# Patient Record
Sex: Male | Born: 1935 | Race: White | Hispanic: No | Marital: Married | Smoking: Former smoker
Health system: Southern US, Community
[De-identification: ages and names within clinical notes are randomized; demographics above are authoritative.]

## PROBLEM LIST (undated history)

## (undated) DIAGNOSIS — I6529 Occlusion and stenosis of unspecified carotid artery: Secondary | ICD-10-CM

## (undated) DIAGNOSIS — I2510 Atherosclerotic heart disease of native coronary artery without angina pectoris: Secondary | ICD-10-CM

## (undated) DIAGNOSIS — I219 Acute myocardial infarction, unspecified: Secondary | ICD-10-CM

## (undated) DIAGNOSIS — M1990 Unspecified osteoarthritis, unspecified site: Secondary | ICD-10-CM

## (undated) DIAGNOSIS — I714 Abdominal aortic aneurysm, without rupture, unspecified: Secondary | ICD-10-CM

## (undated) DIAGNOSIS — E785 Hyperlipidemia, unspecified: Secondary | ICD-10-CM

## (undated) DIAGNOSIS — E119 Type 2 diabetes mellitus without complications: Secondary | ICD-10-CM

## (undated) DIAGNOSIS — I10 Essential (primary) hypertension: Secondary | ICD-10-CM

## (undated) HISTORY — DX: Abdominal aortic aneurysm, without rupture: I71.4

## (undated) HISTORY — DX: Essential (primary) hypertension: I10

## (undated) HISTORY — DX: Occlusion and stenosis of unspecified carotid artery: I65.29

## (undated) HISTORY — DX: Atherosclerotic heart disease of native coronary artery without angina pectoris: I25.10

## (undated) HISTORY — DX: Type 2 diabetes mellitus without complications: E11.9

## (undated) HISTORY — PX: CATARACT EXTRACTION, BILATERAL: SHX1313

## (undated) HISTORY — DX: Abdominal aortic aneurysm, without rupture, unspecified: I71.40

## (undated) HISTORY — DX: Acute myocardial infarction, unspecified: I21.9

## (undated) HISTORY — DX: Hyperlipidemia, unspecified: E78.5

---

## 1993-09-30 DIAGNOSIS — I219 Acute myocardial infarction, unspecified: Secondary | ICD-10-CM

## 1993-09-30 HISTORY — DX: Acute myocardial infarction, unspecified: I21.9

## 2002-04-30 ENCOUNTER — Ambulatory Visit (HOSPITAL_COMMUNITY): Admission: RE | Admit: 2002-04-30 | Discharge: 2002-04-30 | Payer: Self-pay | Admitting: Family Medicine

## 2002-04-30 ENCOUNTER — Encounter: Payer: Self-pay | Admitting: Family Medicine

## 2002-05-04 ENCOUNTER — Ambulatory Visit (HOSPITAL_COMMUNITY): Admission: RE | Admit: 2002-05-04 | Discharge: 2002-05-04 | Payer: Self-pay | Admitting: Family Medicine

## 2002-05-04 ENCOUNTER — Encounter: Payer: Self-pay | Admitting: Family Medicine

## 2002-12-23 ENCOUNTER — Ambulatory Visit (HOSPITAL_COMMUNITY): Admission: RE | Admit: 2002-12-23 | Discharge: 2002-12-23 | Payer: Self-pay | Admitting: General Surgery

## 2003-10-21 ENCOUNTER — Ambulatory Visit (HOSPITAL_COMMUNITY): Admission: RE | Admit: 2003-10-21 | Discharge: 2003-10-21 | Payer: Self-pay | Admitting: Cardiology

## 2004-11-29 ENCOUNTER — Ambulatory Visit: Payer: Self-pay | Admitting: Cardiology

## 2006-02-11 ENCOUNTER — Ambulatory Visit: Payer: Self-pay | Admitting: Cardiology

## 2006-02-27 ENCOUNTER — Ambulatory Visit: Payer: Self-pay | Admitting: Cardiology

## 2006-04-10 ENCOUNTER — Ambulatory Visit: Payer: Self-pay | Admitting: Cardiology

## 2006-11-10 ENCOUNTER — Ambulatory Visit: Payer: Self-pay | Admitting: Vascular Surgery

## 2007-10-08 ENCOUNTER — Encounter: Payer: Self-pay | Admitting: Cardiology

## 2007-11-13 ENCOUNTER — Ambulatory Visit: Payer: Self-pay | Admitting: Vascular Surgery

## 2007-11-20 ENCOUNTER — Ambulatory Visit: Payer: Self-pay | Admitting: Cardiology

## 2007-11-20 ENCOUNTER — Encounter: Payer: Self-pay | Admitting: Physician Assistant

## 2007-12-03 ENCOUNTER — Ambulatory Visit: Payer: Self-pay | Admitting: Cardiology

## 2007-12-25 ENCOUNTER — Ambulatory Visit: Payer: Self-pay | Admitting: Cardiology

## 2008-11-11 ENCOUNTER — Encounter: Payer: Self-pay | Admitting: Cardiology

## 2008-11-11 ENCOUNTER — Ambulatory Visit: Payer: Self-pay | Admitting: Vascular Surgery

## 2009-02-23 ENCOUNTER — Ambulatory Visit: Payer: Self-pay | Admitting: Cardiology

## 2009-02-24 ENCOUNTER — Encounter: Payer: Self-pay | Admitting: Cardiology

## 2009-04-26 ENCOUNTER — Ambulatory Visit: Payer: Self-pay | Admitting: Vascular Surgery

## 2009-04-30 HISTORY — PX: CAROTID ENDARTERECTOMY: SUR193

## 2009-05-25 ENCOUNTER — Inpatient Hospital Stay (HOSPITAL_COMMUNITY): Admission: RE | Admit: 2009-05-25 | Discharge: 2009-05-26 | Payer: Self-pay | Admitting: Vascular Surgery

## 2009-05-25 ENCOUNTER — Encounter: Payer: Self-pay | Admitting: Vascular Surgery

## 2009-05-25 ENCOUNTER — Ambulatory Visit: Payer: Self-pay | Admitting: Vascular Surgery

## 2009-06-09 ENCOUNTER — Encounter: Payer: Self-pay | Admitting: Cardiology

## 2009-06-09 ENCOUNTER — Ambulatory Visit: Payer: Self-pay | Admitting: Vascular Surgery

## 2009-07-03 DIAGNOSIS — R0602 Shortness of breath: Secondary | ICD-10-CM | POA: Insufficient documentation

## 2009-07-03 DIAGNOSIS — R0989 Other specified symptoms and signs involving the circulatory and respiratory systems: Secondary | ICD-10-CM | POA: Insufficient documentation

## 2009-07-03 DIAGNOSIS — I10 Essential (primary) hypertension: Secondary | ICD-10-CM

## 2009-07-03 DIAGNOSIS — I714 Abdominal aortic aneurysm, without rupture, unspecified: Secondary | ICD-10-CM | POA: Insufficient documentation

## 2009-07-03 DIAGNOSIS — E782 Mixed hyperlipidemia: Secondary | ICD-10-CM | POA: Insufficient documentation

## 2009-07-03 DIAGNOSIS — I2510 Atherosclerotic heart disease of native coronary artery without angina pectoris: Secondary | ICD-10-CM | POA: Insufficient documentation

## 2009-10-05 ENCOUNTER — Ambulatory Visit: Payer: Self-pay | Admitting: Cardiology

## 2009-12-22 ENCOUNTER — Encounter: Payer: Self-pay | Admitting: Cardiology

## 2009-12-22 ENCOUNTER — Ambulatory Visit: Payer: Self-pay | Admitting: Vascular Surgery

## 2010-02-16 ENCOUNTER — Encounter: Payer: Self-pay | Admitting: Cardiology

## 2010-05-24 ENCOUNTER — Ambulatory Visit: Payer: Self-pay | Admitting: Vascular Surgery

## 2010-05-25 ENCOUNTER — Encounter: Payer: Self-pay | Admitting: Cardiology

## 2010-06-20 ENCOUNTER — Encounter: Payer: Self-pay | Admitting: Cardiology

## 2010-06-27 ENCOUNTER — Telehealth (INDEPENDENT_AMBULATORY_CARE_PROVIDER_SITE_OTHER): Payer: Self-pay | Admitting: *Deleted

## 2010-07-04 ENCOUNTER — Ambulatory Visit: Payer: Self-pay | Admitting: Cardiology

## 2010-07-04 ENCOUNTER — Encounter (INDEPENDENT_AMBULATORY_CARE_PROVIDER_SITE_OTHER): Payer: Self-pay | Admitting: *Deleted

## 2010-07-06 ENCOUNTER — Ambulatory Visit: Payer: Self-pay | Admitting: Cardiology

## 2010-07-30 ENCOUNTER — Encounter: Payer: Self-pay | Admitting: Cardiology

## 2010-07-31 ENCOUNTER — Encounter: Payer: Self-pay | Admitting: Cardiology

## 2010-10-30 NOTE — Miscellaneous (Signed)
Summary: Orders Update - cbc  Clinical Lists Changes  Orders: Added new Test order of T-CBC No Diff ([id number]) - Signed 

## 2010-10-30 NOTE — Assessment & Plan Note (Signed)
Summary: 6 MO F/U PER NOV REMINDER-JM   Visit Type:  Follow-up Primary Provider:  Neita Carp  CC:  follow-up visit.  History of Present Illness: No chest pain. No dyspnea. s/p CEA no complicatiions. NYHA classI, no orthopnea or PND.No palpitations or syncope.   The patient has known coronary arteries he is 100% occluded proximal right coronary artery.  He also has a history of left carotid endarterectomy in October of 2010 as well as abdominal aortic aneurysm followed by Dr. early.  The patient is otherwise stable from a cardiovascular perspective.  He is concerned however about the cost of his Benicar and is requesting generic Cozaar.  Preventive Screening-Counseling & Management  Alcohol-Tobacco     Smoking Status: quit     Year Quit: 1978  Clinical Review Panels:  CXR CXR results  Heart size normal.  Aorta mildly tortuous.  Lungs   hyperaerated with some scarring at the bases.  No acute process   evident.  There is a probable small calcified granuloma in the left   upper lobe projecting over the second rib peripherally.  Osseous   structures intact.    IMPRESSION:   Chronic changes as above - no active disease. (02/19/2009)  Echocardiogram Echocardiogram Conclusions:         1. The estimated ejection fraction is 55-60%.          2. Global left ventricular wall motion and contractility are within         normal limits.         3. The  basal inferior wall segment is akinetic.         4. There is mild aortic regurgitation.          5. There is mild dilatation of the ascending aorta.         6. There is a trace of mitral regurgitation.         7. The inferior vena cava appears normal. 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  He does not have any significant left coronary  artery disease.   Continued medical therapy is warranted.  Tight control of his  diabetes  including close followup of his hemoglobin A1c would be warranted.   The patient will continue his beta blocker and ACE inhibitor therapy.   Of note, I do not see that he is on a statin drug and followup lipids may be  in order.                                            Charlton Haws, M.D. (10/21/2003)    Current Medications (verified): 1)  Imdur 60 Mg Xr24h-Tab (Isosorbide Mononitrate) .... Take 1 Tablet By Mouth Once A Day 2)  Losartan Potassium 50 Mg Tabs (Losartan Potassium) .... Take 1 Tablet By Mouth Once A Day 3)  Nitroglycerin 0.4 Mg Subl (Nitroglycerin) .... Place One Tablet Under Tongue As Needed For Severe Chest Pain Every 5 Minutes, Not To Exceed 3 in 15 Min Time Frame 4)  Colchicine 0.6 Mg Tabs (Colchicine) .... Take 1 Tablet By Mouth Once A Day 5)  Lovastatin 20 Mg Tabs (Lovastatin) .... Take 1 Tablet By Mouth Once A Day 6)  Glyburide 5 Mg Tabs (Glyburide) .... Take 1 Tablet By Mouth  Two Times A Day 7)  Allopurinol 300 Mg Tabs (Allopurinol) .... Take 1 Tablet By Mouth Once A Day 8)  Hydrochlorothiazide 12.5 Mg Caps (Hydrochlorothiazide) .... Take 1 Tablet By Mouth Once A Day 9)  Aspir-Low 81 Mg Tbec (Aspirin) .... Take 1 Tablet By Mouth Once A Day 10)  Metformin Hcl 500 Mg Tabs (Metformin Hcl) .... Take 1 Tablet By Mouth Daily 11)  Atenolol 50 Mg Tabs (Atenolol) .... Take 1 Tablet By Mouth Three Times A Day  Allergies (verified): No Known Drug Allergies  Comments:  Nurse/Medical Assistant: The patient's medications and allergies were reviewed with the patient and were updated in the Medication and Allergy Lists. Patient brought list to visit.  Past History:  Past Medical History: Last updated: 07/03/2009 ABDOMINAL AORTIC ANEURYSM (ICD-441.4) HYPERTENSION, UNSPECIFIED (ICD-401.9) HYPERLIPIDEMIA-MIXED (ICD-272.4) BRUIT (ICD-785.9) CAD, NATIVE VESSEL (ICD-414.01) DYSPNEA (ICD-786.05) Type 2 diabetes mellitus  Past Surgical History: Last updated:  07/03/2009 Carotid Endarterectomy  Family History: Last updated: 10/05/2009 [place]  Social History: Last updated: 07/03/2009 Retired  Married  Tobacco Use - No.   Risk Factors: Smoking Status: quit (10/05/2009)  Family History: Reviewed history and no changes required. [place]  Social History: Smoking Status:  quit  Review of Systems  The patient denies fatigue, malaise, fever, weight gain/loss, vision loss, decreased hearing, hoarseness, chest pain, palpitations, shortness of breath, prolonged cough, wheezing, sleep apnea, coughing up blood, abdominal pain, blood in stool, nausea, vomiting, diarrhea, heartburn, incontinence, blood in urine, muscle weakness, joint pain, leg swelling, rash, skin lesions, headache, fainting, dizziness, depression, anxiety, enlarged lymph nodes, easy bruising or bleeding, and environmental allergies.    Vital Signs:  Patient profile:   77 year old male Height:      70 inches Weight:      200 pounds BMI:     28.80 Pulse rate:   59 / minute BP sitting:   142 / 70  (left arm) Cuff size:   regular  Vitals Entered By: Carlye Grippe (October 05, 2009 8:24 AM)  Nutrition Counseling: Patient's BMI is greater than 25 and therefore counseled on weight management options. CC: follow-up visit   Physical Exam  General:  Well developed, well nourished, in no acute distress. Head:  normocephalic and atraumatic Eyes:  PERRLA/EOM intact; conjunctiva and lids normal. Nose:  no deformity, discharge, inflammation, or lesions Mouth:  Teeth, gums and palate normal. Oral mucosa normal. Neck:  Neck supple, no JVD. No masses, thyromegaly or abnormal cervical nodes. Lungs:  Clear bilaterally to auscultation and percussion. Heart:  Non-displaced PMI, chest non-tender; regular rate and rhythm, S1, S2 without murmurs, rubs or gallops. Carotid upstroke normal, no bruit. Normal abdominal aortic size, no bruits. Femorals normal pulses, no bruits. Pedals normal pulses. No  edema, no varicosities. Abdomen:  Bowel sounds positive; abdomen soft and non-tender without masses, organomegaly, or hernias noted. No hepatosplenomegaly. Msk:  Back normal, normal gait. Muscle strength and tone normal. Pulses:  pulses normal in all 4 extremities Extremities:  No clubbing or cyanosis. Neurologic:  Alert and oriented x 3. Skin:  Intact without lesions or rashes. Cervical Nodes:  no significant adenopathy Psych:  Normal affect.   Impression & Recommendations:  Problem # 1:  CAD, NATIVE VESSEL (ICD-414.01) the patient reports no chest pain.  Continue aggressive risk factor modification. The following medications were removed from the medication list:    Imdur 60 Mg Xr24h-tab (Isosorbide mononitrate) .Marland Kitchen... Take 1 tablet by mouth once a day His updated medication list for this problem  includes:    Imdur 60 Mg Xr24h-tab (Isosorbide mononitrate) .Marland Kitchen... Take 1 tablet by mouth once a day    Nitroglycerin 0.4 Mg Subl (Nitroglycerin) .Marland Kitchen... Place one tablet under tongue as needed for severe chest pain every 5 minutes, not to exceed 3 in 15 min time frame    Aspir-low 81 Mg Tbec (Aspirin) .Marland Kitchen... Take 1 tablet by mouth once a day    Atenolol 50 Mg Tabs (Atenolol) .Marland Kitchen... Take 1 tablet by mouth three times a day  Problem # 2:  ABDOMINAL AORTIC ANEURYSM (ICD-441.4) fall bivascular surgery.  Problem # 3:  HYPERTENSION, UNSPECIFIED (ICD-401.9) the patient will be changed from Benicar to generic Cozaar at a dose of 50 mg a day. His updated medication list for this problem includes:    Losartan Potassium 50 Mg Tabs (Losartan potassium) .Marland Kitchen... Take 1 tablet by mouth once a day    Hydrochlorothiazide 12.5 Mg Caps (Hydrochlorothiazide) .Marland Kitchen... Take 1 tablet by mouth once a day    Aspir-low 81 Mg Tbec (Aspirin) .Marland Kitchen... Take 1 tablet by mouth once a day    Atenolol 50 Mg Tabs (Atenolol) .Marland Kitchen... Take 1 tablet by mouth three times a day  Problem # 4:  HYPERLIPIDEMIA-MIXED (ICD-272.4) the patient's  current lovastatin and his lipid panel can be monitored by Dr. Neita Carp. His updated medication list for this problem includes:    Lovastatin 20 Mg Tabs (Lovastatin) .Marland Kitchen... Take 1 tablet by mouth once a day  Patient Instructions: 1)  Change Benicar to Losartan 50mg  daily.   2)  Follow up in 6 months.   Prescriptions: LOSARTAN POTASSIUM 50 MG TABS (LOSARTAN POTASSIUM) Take 1 tablet by mouth once a day  #90 x 3   Entered by:   Hoover Brunette, LPN   Authorized by:   Lewayne Bunting, MD, [facility]   Signed by:   Hoover Brunette, LPN on 60/45/4098   Method used:   [facility]        [facility]  [address] [address] [id number]       [address]       [place], [place]  [address]       Ph: [phone] or [phone]       Fax: [phone]   RxID:   [id number]

## 2010-10-30 NOTE — Assessment & Plan Note (Signed)
Summary: 6 MONTH FU-RECV REMINDER VS   Visit Type:  Follow-up Primary Provider:  [place]   History of Present Illness: the patient is a 77 year old male with history of coronary artery disease,AAA at and status post carotid endarterectomy.the patient is followed in [place] by vascular surgery.his aneurysm and carotid artery disease has remained stable.  He reports several weeks ago an episode of prolonged chest pain lasting 3 hours. He also has been complaining of some neck pain and was felt to have arthritis. This particular episode however was in the middle of the night and was radiating to the chest and left shoulder as well as left arm. The patient did not take any nitroglycerin. He does not have any exertional chest pain but now feels that he short of breath on minimal exertion.  The patient did not have a stress test in the last 3 years. His last catheterization was in 2005 when he was found to 100% occluded proximal right coronary artery but no significant disease in the left system.  Preventive Screening-Counseling & Management  Alcohol-Tobacco     Smoking Status: quit     Year Quit: 1978  Current Medications (verified): 1)  Imdur 60 Mg Xr24h-Tab (Isosorbide Mononitrate) .... Take 1 Tablet By Mouth Once A Day 2)  Losartan Potassium 50 Mg Tabs (Losartan Potassium) .... Take 1 Tablet By Mouth Once A Day 3)  Nitroglycerin 0.4 Mg Subl (Nitroglycerin) .... Place One Tablet Under Tongue As Needed For Severe Chest Pain Every 5 Minutes, Not To Exceed 3 in 15 Min Time Frame 4)  Colchicine 0.6 Mg Tabs (Colchicine) .... Take 1 Tablet By Mouth Once A Day 5)  Lovastatin 20 Mg Tabs (Lovastatin) .... Take 1 Tablet By Mouth Once A Day 6)  Glyburide 5 Mg Tabs (Glyburide) .... Take 1 Tablet By Mouth Two Times A Day 7)  Allopurinol 300 Mg Tabs (Allopurinol) .... Take 1 Tablet By Mouth Once A Day 8)  Hydrochlorothiazide 12.5 Mg Caps (Hydrochlorothiazide) .... Take 1 Tablet By Mouth Once A Day 9)   Aspir-Low 81 Mg Tbec (Aspirin) .... Take 1 Tablet By Mouth Once A Day 10)  Metformin Hcl 500 Mg Tabs (Metformin Hcl) .... Take 1 Tablet By Mouth Daily 11)  Atenolol 50 Mg Tabs (Atenolol) .... Take 1 Tablet By Mouth Three Times A Day  Allergies (verified): No Known Drug Allergies  Comments:  Nurse/Medical Assistant: The patient is currently on medications but does not know the name or dosage at this time. Instructed to contact our office with details. Will update medication list at that time.  Past History:  Past Surgical History: Last updated: 07/03/2009 Carotid Endarterectomy  Family History: Last updated: 10/05/2009 [place]  Social History: Last updated: 07/03/2009 Retired  Married  Tobacco Use - No.   Risk Factors: Smoking Status: quit (07/04/2010)  Past Medical History: ABDOMINAL AORTIC ANEURYSM (ICD-441.4) abdominal aortic aneurysm 4.4 x 4.4 cm August 2011 HYPERTENSION, UNSPECIFIED (ICD-401.9) HYPERLIPIDEMIA-MIXED (ICD-272.4) BRUIT (ICD-785.9) CAD, NATIVE VESSEL (ICD-414.01) DYSPNEA (ICD-786.05) Type 2 diabetes mellitus Status post carotid endarterectomy right side Dr. Langston Masker August 2011 no stenosis of the left internal carotid artery and patent right internal carotid artery  Review of Systems       The patient complains of fatigue, chest pain, and shortness of breath.  The patient denies malaise, fever, weight gain/loss, vision loss, decreased hearing, hoarseness, palpitations, prolonged cough, wheezing, sleep apnea, coughing up blood, abdominal pain, blood in stool, nausea, vomiting, diarrhea, heartburn, incontinence, blood in urine, muscle weakness, joint pain,  leg swelling, rash, skin lesions, headache, fainting, dizziness, depression, anxiety, enlarged lymph nodes, easy bruising or bleeding, and environmental allergies.    Vital Signs:  Patient profile:   77 year old male Height:      70 inches Weight:      194 pounds BMI:     27.94 Pulse rate:   57 /  minute BP sitting:   158 / 86  (left arm) Cuff size:   regular  Vitals Entered By: Carlye Grippe (July 04, 2010 10:22 AM)  Nutrition Counseling: Patient's BMI is greater than 25 and therefore counseled on weight management options.  Physical Exam  Additional Exam:  General: Well-developed, well-nourished in no distress,somewhat pale-appearing head: Normocephalic and atraumatic eyes PERRLA/EOMI intact, conjunctiva and lids normal nose: No deformity or lesions mouth normal dentition, normal posterior pharynx neck: Supple, no JVD.  No masses, thyromegaly or abnormal cervical nodes lungs: Normal breath sounds bilaterally without wheezing.  Normal percussion heart: regular rate and rhythm with normal S1 and S2, no S3 or S4.  PMI is normal.  No pathological murmurs abdomen: Normal bowel sounds, abdomen is soft and nontender without masses, organomegaly or hernias noted.  No hepatosplenomegaly musculoskeletal: Back normal, normal gait muscle strength and tone normal pulsus: Pulse is normal in all 4 extremities Extremities: No peripheral pitting edema neurologic: Alert and oriented x 3 skin: Intact without lesions or rashes cervical nodes: No significant adenopathy psychologic: Normal affect    EKG  Procedure date:  07/04/2010  Findings:      sinus bradycardia otherwise normal EKG. Heart rate 55 beats per minute  Impression & Recommendations:  Problem # 1:  DYSPNEA (ICD-786.05) the patient now reports increasing dyspnea. the patient had an episode of chest pain lasting 3 hours. He will be referred for Lexiscan.we'll also make sure that the patient had a CBC done within the last 6 months His updated medication list for this problem includes:    Losartan Potassium 50 Mg Tabs (Losartan potassium) .Marland Kitchen... Take 1 tablet by mouth once a day    Hydrochlorothiazide 12.5 Mg Caps (Hydrochlorothiazide) .Marland Kitchen... Take 1 tablet by mouth once a day    Aspir-low 81 Mg Tbec (Aspirin) .Marland Kitchen... Take 1  tablet by mouth once a day    Atenolol 50 Mg Tabs (Atenolol) .Marland Kitchen... Take 1 tablet by mouth three times a day  Problem # 2:  ABDOMINAL AORTIC ANEURYSM (ICD-441.4) follow bibasilar surgery and stable.  Problem # 3:  CAD, NATIVE VESSEL (ICD-414.01) no acute changes on electrocardiogram. Sinus bradycardia. His updated medication list for this problem includes:    Imdur 60 Mg Xr24h-tab (Isosorbide mononitrate) .Marland Kitchen... Take 1 tablet by mouth once a day    Nitroglycerin 0.4 Mg Subl (Nitroglycerin) .Marland Kitchen... Place one tablet under tongue as needed for severe chest pain every 5 minutes, not to exceed 3 in 15 min time frame    Aspir-low 81 Mg Tbec (Aspirin) .Marland Kitchen... Take 1 tablet by mouth once a day    Atenolol 50 Mg Tabs (Atenolol) .Marland Kitchen... Take 1 tablet by mouth three times a day  Orders: EKG w/ Interpretation (93000) Nuclear Med (Nuc Med)  Problem # 4:  HYPERTENSION, UNSPECIFIED (ICD-401.9) blood pressure elevated in the office today but I was reassured by his wife and his blood pressure is normal at home. His updated medication list for this problem includes:    Losartan Potassium 50 Mg Tabs (Losartan potassium) .Marland Kitchen... Take 1 tablet by mouth once a day    Hydrochlorothiazide 12.5 Mg  Caps (Hydrochlorothiazide) .Marland Kitchen... Take 1 tablet by mouth once a day    Aspir-low 81 Mg Tbec (Aspirin) .Marland Kitchen... Take 1 tablet by mouth once a day    Atenolol 50 Mg Tabs (Atenolol) .Marland Kitchen... Take 1 tablet by mouth three times a day  Patient Instructions: 1)  Your physician has requested that you have an Lexiscan Cardiolite.  For further information please visit [email or web address]  Please follow instruction sheet, as given. If the results of your test are normal or stable, you will receive a letter. If they are abnormal, the nurse will contact you by phone. 2)  Your physician wants you to follow-up in: 6 months. You will receive a reminder letter in the mail one-two months in advance. If you don't receive a letter, please call our office  to schedule the follow-up appointment. 3)  Your physician recommends that you continue on your current medications as directed. Please refer to the Current Medication list given to you today. Prescriptions: NITROGLYCERIN 0.4 MG SUBL (NITROGLYCERIN) place one tablet under tongue as needed for severe chest pain every 5 minutes, not to exceed 3 in 15 min time frame  #25 x 2   Entered by:   Cyril Loosen, RN, BSN   Authorized by:   Lewayne Bunting, MD, [facility]   Signed by:   Cyril Loosen, RN, BSN on 07/04/2010   Method used:   [facility]  [address] [address] [id number]       [address]       [place], [place]  [address]       Ph: [phone] or [phone]       Fax: [phone]   RxID:   [id number] IMDUR 60 MG XR24H-TAB (ISOSORBIDE MONONITRATE) Take 1 tablet by mouth once a day  #90 Tablet x 3   Entered by:   Cyril Loosen, RN, BSN   Authorized by:   Lewayne Bunting, MD, [facility]   Signed by:   Cyril Loosen, RN, BSN on 07/04/2010   Method used:   [facility]        [facility]  [address] [id number]       [address]       [place], [place]  [address]       Ph: [phone] or [phone]       Fax: [phone]   RxID:   [id number]    Appended Document: 6 MONTH FU-RECV REMINDER VS Per Dr. Andee Lineman - could you please check and see if patient had a CBC done within the last 3-6 months. He has dyspnea and appeared pale today. I forgot to order this. can be done at the time of his stress test   Discussed with pt.  States he has labs done at Dr. Dian Situ office every 4 months.  Will send fax to request info from PMD.  If none, will have pt get a [facility].  Patient verbalized understanding.

## 2010-10-30 NOTE — Letter (Signed)
Summary: Lexiscan or Dobutamine Pharmacist, community at [facility]  [address]   [place], [place] [address]   Phone: [phone]  Fax: [phone]      [facility] Cardiovascular Services  Lexiscan or Dobutamine Cardiolite Strss Test    Hubert Sugg  Appointment Date:_  Appointment Time:_  Your doctor has ordered a CARDIOLITE STRESS TEST using a medication to stimulate exercise so that you will not have to walk on the treadmill to determine the condition of your heart during stress. If you take blood pressure medication, ask your doctor if you should take it the day of your test. You should not have anything to eat or drink at least 4 hours before your test is scheduled, and no caffeine, including decaffeinated tea and coffee, chocolate, and soft drinks for 24 hours before your test.  You will need to register at the Outpatient/Main Entrance at the hospital 15 minutes before your appointment time. It is a good idea to bring a copy of your order with you. They will direct you to the Diagnostic Imaging (Radiology) Department.  You will be asked to undress from the waist up and given a hospital gown to wear, so dress comfortably from the waist down for example: Sweat pants, shorts, or skirt Rubber soled lace up shoes (tennis shoes)  Plan on about three hours from registration to release from the hospital  Hold your diabetic medications the morning of your test, if you do not eat breakfast. Hold your HCTZ the morning of your test.

## 2010-10-30 NOTE — Progress Notes (Signed)
Summary: chest pain vs. arthritis  Phone Note Other Incoming   Caller: wife - Alvira Philips  Summary of Call: Had been having pain deep in left shoulder x several weeks (arthritis) .  Saw Dr. Neita Carp yesterday, did x-rays and did confirm alot of arthritis.  Did have episode last p.m. of chest pain in middle of night.  Lasted about an hour, but he did not wake her.  Stated he feels perfectly fine today. States that they think that it may be related to the arthritis though.  Did advise her that if chest pain happened again to go ahead and use the NTG to see if he gets relief.  If not, then call 911 or go to ED for evaluation.  She verbalized understanding.  He does have pending OV scheduled for 10/5.    Initial call taken by: Hoover Brunette, LPN,  June 27, 2010 4:15 PM  Follow-up for Phone Call        OK Follow-up by: Lewayne Bunting, MD, [facility],  July 01, 2010 4:53 PM

## 2010-12-04 ENCOUNTER — Encounter (INDEPENDENT_AMBULATORY_CARE_PROVIDER_SITE_OTHER): Payer: Medicare Other

## 2010-12-04 ENCOUNTER — Other Ambulatory Visit (INDEPENDENT_AMBULATORY_CARE_PROVIDER_SITE_OTHER): Payer: Medicare Other

## 2010-12-04 DIAGNOSIS — I714 Abdominal aortic aneurysm, without rupture, unspecified: Secondary | ICD-10-CM

## 2010-12-04 DIAGNOSIS — I6529 Occlusion and stenosis of unspecified carotid artery: Secondary | ICD-10-CM

## 2010-12-04 DIAGNOSIS — Z48812 Encounter for surgical aftercare following surgery on the circulatory system: Secondary | ICD-10-CM

## 2010-12-12 NOTE — Procedures (Unsigned)
CAROTID DUPLEX EXAM  INDICATION:  Followup carotid artery disease.  HISTORY: Diabetes:  Yes. Cardiac:  MI. Hypertension:  Yes. Smoking:  Previous. Previous Surgery:  Right carotid endarterectomy 05/01/2009. CV History:  The patient is currently asymptomatic. Amaurosis Fugax No, Paresthesias No, Hemiparesis No                                      RIGHT             LEFT Brachial systolic pressure:         128               126 Brachial Doppler waveforms:         WNL               WNL Vertebral direction of flow:        Atypical antegrade                  Atypical antegrade DUPLEX VELOCITIES (cm/sec) CCA peak systolic                   58                70 ECA peak systolic                   69                72 ICA peak systolic                   76                62 ICA end diastolic                   28                18 PLAQUE MORPHOLOGY:                  Heterogeneous     Heterogeneous PLAQUE AMOUNT:                      Mild              Mild PLAQUE LOCATION:                    CCA               Bulb and ICA  IMPRESSION:  Patent right carotid endarterectomy with no hemodynamically significant stenosis.  Left side no hemodynamically significant stenosis.  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  Intimal thickening within the left common carotid artery.  Study stable compared to previous.  ___________________________________________ Larina Earthly, M.D.  OD/MEDQ  D:  12/04/2010  T:  12/04/2010  Job:  [id number]

## 2010-12-12 NOTE — Procedures (Unsigned)
DUPLEX ULTRASOUND OF ABDOMINAL AORTA  INDICATION:  Follow up abdominal aortic aneurysm.  HISTORY: Diabetes:  Yes. Cardiac:  MI. Hypertension:  Yes. Smoking:  Previous. Connective Tissue Disorder: Family History:  No. Previous Surgery:  No.  DUPLEX EXAM:         AP (cm)                   TRANSVERSE (cm) Proximal             1.7 cm                    2.7 cm Mid                  2.5 cm                    2.5 cm Distal               4.7 cm                    4.6 cm Right Iliac          1.5 cm                    1.1 cm Left Iliac           1.5 cm                    1.6 cm  PREVIOUS:  Date:  AP:  4.4  TRANSVERSE:  4.4  IMPRESSION:  Stable appearing abdominal aortic aneurysm within the distal aorta measuring 4.7 x 4.6 cm, a slight increase from previous study.  Fusiform dilatation of the mid aorta with intraluminal thrombus and atherosclerosis visualized within the mid distal aorta.  The left iliac appears dilated and ectatic .  ___________________________________________ Larina Earthly, M.D.  OD/MEDQ  D:  12/04/2010  T:  12/04/2010  Job:  [id number]

## 2010-12-22 ENCOUNTER — Other Ambulatory Visit: Payer: Self-pay | Admitting: Cardiology

## 2010-12-27 NOTE — Telephone Encounter (Signed)
[place] 

## 2011-01-03 ENCOUNTER — Encounter (INDEPENDENT_AMBULATORY_CARE_PROVIDER_SITE_OTHER): Payer: Self-pay | Admitting: Internal Medicine

## 2011-01-05 LAB — PROTIME-INR: INR: 0.9 (ref 0.00–1.49)

## 2011-01-05 LAB — BASIC METABOLIC PANEL
Calcium: 8.6 mg/dL (ref 8.4–10.5)
GFR calc Af Amer: >60 mL/min (ref >60)
GFR calc non Af Amer: 55 mL/min — ABNORMAL LOW (ref >60)
Glucose, Bld: 79 mg/dL (ref 70–99)
Sodium: 136 mEq/L (ref 135–145)

## 2011-01-05 LAB — URINALYSIS, ROUTINE W REFLEX MICROSCOPIC
Nitrite: NEGATIVE
Specific Gravity, Urine: 1.019 (ref 1.005–1.030)
Urobilinogen, UA: 0.2 mg/dL (ref 0.0–1.0)
pH: 5 (ref 5.0–8.0)

## 2011-01-05 LAB — COMPREHENSIVE METABOLIC PANEL
AST: 28 U/L (ref 0–37)
Albumin: 4.1 g/dL (ref 3.5–5.2)
Alkaline Phosphatase: 52 U/L (ref 39–117)
BUN: 37 mg/dL — ABNORMAL HIGH (ref 6–23)
Creatinine, Ser: 1.67 mg/dL — ABNORMAL HIGH (ref 0.4–1.5)
GFR calc Af Amer: 48 mL/min — ABNORMAL LOW (ref >60)
Potassium: 4.7 mEq/L (ref 3.5–5.1)
Total Protein: 7.4 g/dL (ref 6.0–8.3)

## 2011-01-05 LAB — GLUCOSE, CAPILLARY: Glucose-Capillary: 144 mg/dL — ABNORMAL HIGH (ref 70–99)

## 2011-01-05 LAB — TYPE AND SCREEN
ABO/RH(D): O POS
Antibody Screen: NEGATIVE

## 2011-01-05 LAB — CBC
Hemoglobin: 10.5 g/dL — ABNORMAL LOW (ref 13.0–17.0)
Hemoglobin: 12.4 g/dL — ABNORMAL LOW (ref 13.0–17.0)
MCV: 96.7 fL (ref 78.0–100.0)
RBC: 3.16 MIL/uL — ABNORMAL LOW (ref 4.22–5.81)
RBC: 3.72 MIL/uL — ABNORMAL LOW (ref 4.22–5.81)
RDW: 15 % (ref 11.5–15.5)
WBC: 6 10*3/uL (ref 4.0–10.5)
WBC: 6.6 10*3/uL (ref 4.0–10.5)

## 2011-01-05 LAB — APTT: aPTT: 25 seconds (ref 24–37)

## 2011-02-12 NOTE — Assessment & Plan Note (Signed)
[facility]                          [facility] OFFICE NOTE   NAME:Luke [name], Luke [name]                       MRN:          [id number]  DATE:11/20/2007                            DOB:          1929-05-27    PRIMARY CARDIOLOGIST:  Luke Codding, MD,FACC   REASON FOR VISIT:  Annual followup.   HISTORY:  Luke [name] returns to our clinic for continued monitoring of  known coronary artery disease.  He was last seen here in 2007, at which  time he complained of intermittent chest discomfort which was felt to be  atypical.  He had just had a recent adenosine Cardiolite suggestive of  prior inferior infarct with some residual ischemia; EF 51%.   His last cardiac catheterization was in January 2005, showing no change  in a known 100% occluded proximal RCA with left - right distal  collateralization.  There was no significant left system disease and  continued medical therapy was recommended.   Today, Luke [name] states that he has not, in fact, had any chest pain  since he suffered a myocardial infarction in 1995.  He has, however,  developed exertional dyspnea which appears to be progressive, since he  was last seen by [place] here in the clinic.   The patient reports compliance with all of his medications.  He has not  smoked tobacco in 30 some odd years.  He also reports having had a  recent lipid profile, with results reportedly okay.   CURRENT MEDICATIONS:  1. Colchicine.  2. Isosorbide 30 daily.  3. Metformin 500 q.a.m./1000 q.p.m.  4. Actos 15 daily.  5. Lovastatin 20 daily.  6. Atenolol 150 daily.  7. Benicar 40 daily.  8. Glyburide 5 b.i.d.  9. Allopurinol.  10.Hydrochlorothiazide 12.5 daily.  11.Aspirin 81 daily.   PHYSICAL EXAMINATION:  VITAL SIGNS:  Blood pressure 150/80, pulse 62,  regular, weight 205.2, sats 98% on room air.  GENERAL:  A 76 year old male, moderately obese, sitting upright in no  distress.  HEENT:  Normocephalic ,  atraumatic.  NECK:  Palpable bilateral carotid pulses without bruits; no JVD.  LUNGS:  Clear to auscultation all fields.  HEART:  Regular rate and rhythm (S1, S2).  No significant murmurs.  No  rubs.  ABDOMEN:  Protuberant, nontender.  Intact bowel sounds.  No epigastric  pulsatile mass or bruit.  EXTREMITIES:  Palpable bilateral femoral pulses without bruits; palpable  distal pulses without edema.  NEUROLOGICAL:  Without focal deficits.   IMPRESSION:  1. Progressive exertional dyspnea.      a.     Worrisome for coronary artery disease progression.  2. Severe single vessel coronary artery disease.      a.     Known, 100% occluded proximal right coronary artery.      b.     Status post out of hospital inferior myocardial infarction       in 1995; unsuccessful PTCA of 100% occluded RCA Luke [name]).  3. Dyslipidemia.  4. Type 2 diabetes mellitus.  5. Hypertension.  6. Abdominal aortic aneurysm.  a.     3.70 cm by recent ultrasound, followed by Luke [name] in       [place].   PLAN:  1. Schedule low level adenosine stress Cardiolite for risk      stratification.  We will compare this result with his previous      study in 2007 to rule out any significant change in myocardial      perfusion, which might be suggestive of possible coronary artery      disease progression.  We will therefore keep a low threshold for a      repeat cardiac catheterization, and the patient is quite agreeable      with this plan.  2. A 2-D echocardiogram for reassessment of left ventricular function      and rule out of underlying structural abnormalities.  3. Nitroglycerin p.r.n.  4. Request most recent lipid profile from Luke [name] office.  5. Schedule early clinic followup with myself and Luke [name] in 1      month, for review of study results and further recommendations.      Luke Serpe, PA-C  Electronically Signed      Luke Codding, MD,FACC  Electronically  Signed   GS/MedQ  DD: 11/20/2007  DT: 11/21/2007  Job #: [id number]   cc:   Luke [name]

## 2011-02-12 NOTE — Assessment & Plan Note (Signed)
OFFICE VISIT   KYEN, TAITE  DOB:  May 15, 1929                                       06/09/2009  EAVWU#:[id number]   The patient presents today for follow-up of his right carotid  endarterectomy, resection of redundant carotid artery and Dacron patch  angioplasty on May 25, 2009, of his right carotid, he did extremely  well in the hospital and was discharged home on postop day #1, he has  had no complications postoperatively.  His neck incision has healed  quite nicely, he is grossly intact neurologically.  His carotid arteries  are without bruits bilaterally.  I am quite pleased with his early  result as is the patient.  Plan to see him again in 6 months with repeat  carotid duplex and also ultrasound of his known moderate abdominal  aortic aneurysm.   Larina Earthly, M.D.  Electronically Signed   TFE/MEDQ  D:  06/09/2009  T:  06/09/2009  Job:  [id number]   cc:   Learta Codding, MD,FACC  Fara Chute

## 2011-02-12 NOTE — Procedures (Signed)
CAROTID DUPLEX EXAM   INDICATION:  Post-right carotid endarterectomy.   HISTORY:  Diabetes:  Yes  Cardiac:  MI  Hypertension:  Yes  Smoking:  Previous  Previous Surgery:  Right carotid endarterectomy 05/25/2009  CV History:  asymptomatic  Amaurosis Fugax Yes No, Paresthesias Yes No, Hemiparesis Yes No                                       RIGHT             LEFT  Brachial systolic pressure:         150               140  Brachial Doppler waveforms:         Triphasic         Triphasic  Vertebral direction of flow:        Atypical          Antegrade  DUPLEX VELOCITIES (cm/sec)  CCA peak systolic                   69                81  ECA peak systolic                   115               106  ICA peak systolic                   111 (M)           96  ICA end diastolic                   47                32  PLAQUE MORPHOLOGY:                  Calcified         None  PLAQUE AMOUNT:                      Mild              None  PLAQUE LOCATION:                    CCA   IMPRESSION:  1. Patent right ICA with no evidence of restenosis, elevated velocity      most likely due to angle of vessel.  2. No plaque visualized on the left.   ___________________________________________  Larina Earthly, M.D.   CJ/MEDQ  D:  12/22/2009  T:  12/22/2009  Job:  [id number]

## 2011-02-12 NOTE — H&P (Signed)
HISTORY AND PHYSICAL EXAMINATION   April 26, 2009   Re:  Luke, [name]                DOB:  [birth year]   ADMISSION DIAGNOSIS:  Severe asymptomatic right internal carotid artery  stenosis.   HISTORY OF PRESENT ILLNESS:  Luke [name] is a very pleasant 77 year old  gentleman who was found upon physical exam to have a right carotid  bruit.  He subsequently underwent duplex which revealed critical  stenosis in his right internal carotid artery and no significant  stenosis in his left internal carotid artery.  He is well known to me  from the prior followup of an asymptomatic infrarenal abdominal aortic  aneurysm.  He specifically denies any prior amaurosis fugax, transient  ischemic attack or stroke.  He does have a history of a known coronary  artery disease with an occluded proximal right carotid artery.  He  denies any chest pain and does have stable exertional dyspnea.  He does  have type 2 diabetes and dyslipidemia and hypertension.   CURRENT MEDICATIONS:  1. Colchicine 0.6 mg daily.  2. Lovastatin 20 mg daily.  3. Isosorbide 60 mg daily.  4. Metformin 500 mg daily.  5. Atenolol 50 mg t.i.d.  6. Benicar 40 mg daily.  7. Glyburide 5 mg twice daily.  8. Allopurinol 300 mg daily.  9. Hydrochlorothiazide 12.5 mg daily.  10.Aspirin 81 mg daily.   ALLERGIES:  None.   PHYSICAL EXAMINATION:  GENERAL:  Well-developed, well-nourished white  male appearing stated age of 61.  VITAL SIGNS:  His blood pressure is 117/77 right arm, 139/82 left arm,  pulse 62, respiratory rate 18.  NEUROLOGIC:  He is grossly intact neurologically.  NECK:  He does have soft right carotid bruit.  He does not have a left  carotid bruits.  EXTREMITIES:  His radial pulses are 2+ and he has 2+ popliteal and  posterior tibial pulses bilaterally.  ABDOMEN:  Reveals an easily palpable aneurysm which is nontender.  HEART:  His heart is regular rate and rhythm.  CHEST:  Clear bilaterally.   He underwent confirmatory duplex in our office of his right carotid and  this did show severe stenosis in his internal carotid artery and greater  than 80% stenosis.  His internal carotid artery was visualized and was  normal.   I discussed options with Mr. and Mrs. Mata and recommended he undergo  elective endarterectomy for reduction of a stroke risk.  I explained the  1% to 2% risk of stroke with surgery and rare risk of cranial nerve  injury as well.  They understand and wish to proceed.  We have scheduled  this after return from vacation of his wife in August 26.  He  understands symptoms of carotid disease and will notify us immediately  should these occur.   Larina Earthly, M.D.  Electronically Signed   TFE/MEDQ  D:  04/26/2009  T:  04/27/2009  Job:  [id number]   cc:   Learta Codding, MD,FACC  Fara Chute

## 2011-02-12 NOTE — Procedures (Signed)
DUPLEX ULTRASOUND OF ABDOMINAL AORTA   INDICATION:  Followup abdominal aortic aneurysm.   HISTORY:  Diabetes:  Yes.  Cardiac:  MI.  Hypertension:  Yes.  Smoking:  Previous.  Connective Tissue Disorder:  Family History:  No.  Previous Surgery:   DUPLEX EXAM:         AP (cm)                   TRANSVERSE (cm)  Proximal             Not visualized            Not visualized  Mid                  2.3 cm                    2.5 cm  Distal               4.4 cm                    4.4 cm  Right Iliac          1.2 cm                    1.2 cm  Left Iliac           1.3 cm                    1.4 cm   PREVIOUS:  Date:  12/22/2009  AP:  4.24  TRANSVERSE:  4.23   IMPRESSION:  1. Aneurysmal dilatation of the distal abdominal aorta noted with no      significant change in maximum diameter.  2. Significant plaque formation noted within the aneurysmal sac with      no significant increase in Doppler velocities noted.  3. Unable to adequately visualize the proximal aorta due to overlying      bowel gas patterns.  4. The patient was not n.p.o. for this exam.   ___________________________________________  Larina Earthly, M.D.   CH/MEDQ  D:  05/25/2010  T:  05/25/2010  Job:  [id number]

## 2011-02-12 NOTE — Procedures (Signed)
DUPLEX ULTRASOUND OF ABDOMINAL AORTA   INDICATION:  Followup abdominal aortic aneurysm.   HISTORY:  Diabetes:  Yes.  On oral medication.  Cardiac:  MI in September of 1995.  Hypertension:  Yes.  Smoking:  No.  Connective Tissue Disorder:  Family History:  No.  Previous Surgery:  No.   DUPLEX EXAM:         AP (cm)                   TRANSVERSE (cm)  Proximal             1.94 cm                   2.33 cm  Mid                  2.08 cm                   2.29 cm  Distal               3.65 cm                   3.77 cm  Right Iliac          1.20 cm  Left Iliac           1.20 cm   PREVIOUS:  Date: 11/10/2006  AP:  3.88  TRANSVERSE:  3.70   IMPRESSION:  1. Stable measurements of known abdominal aortic aneurysm.  2. Irregular plaque within the aneurysm as noted on previous exams.   ___________________________________________  Larina Earthly, M.D.   DP/MEDQ  D:  11/13/2007  T:  11/15/2007  Job:  [id number]

## 2011-02-12 NOTE — Discharge Summary (Signed)
NAME:  Luke [name], Luke [name]                ACCOUNT NO.:  [id number]   MEDICAL RECORD NO.:  [id number]          PATIENT TYPE:  INP   LOCATION:  3311                         FACILITY:  [facility]   PHYSICIAN:  Larina Earthly, M.D.    DATE OF BIRTH:  09/01/1929   DATE OF ADMISSION:  05/25/2009  DATE OF DISCHARGE:  05/26/2009                               DISCHARGE SUMMARY   FINAL DISCHARGE DIAGNOSES:  1. Right carotid occlusive disease.  2. History of gout.  3. Diabetes.  4. Hypertension.   PROCEDURE PERFORMED:  1. Resection of right common carotid artery with primary closure.  2. Right carotid endarterectomy with 10-French shunt and Dacron patch      angioplasty closure by Dr. Arbie Cookey, May 25, 2009.   COMPLICATIONS:  None.   CONDITION ON DISCHARGE:  Stable, improving.   DISCHARGE MEDICATIONS:  He is instructed to resume all previous  rescheduled medications consisting of,  1. Colchicine 0.6 mg p.o. daily.  2. Lovastatin 20 mg p.o. daily.  3. Isosorbide 60 mg p.o. daily.  4. Metformin 500 mg p.o. daily.  5. Atenolol 50 mg p.o. t.i.d.  6. Benicar 40 mg p.o. daily.  7. Glyburide 5 mg p.o. b.i.d.  8. Allopurinol 200 mg p.o. daily.  9. Hydrochlorothiazide 12.5 mg p.o. daily.  10.Aspirin 81 mg p.o. daily.  11.He has given a prescription for Percocet 5/325 1 p.o. q.4 h. p.r.n.      pain, total #20 were given.   DISPOSITION:  He is discharged home in stable condition with his wounds  healing well.  He has given careful instructions regarding the care of  his wounds and activity level.  He is to see Dr. Arbie Cookey in 2 weeks for  continuing followup.  The office will arrange the visit.   BRIEF IDENTIFYING STATEMENT:  For complete details, please refer the  typed history and physical.  Briefly, this very pleasant 77 year old  gentleman was referred to Dr. Arbie Cookey for right carotid artery occlusive  disease.  Dr. Arbie Cookey evaluated him, found him to be a suitable candidate  for right carotid  endarterectomy for stroke prevention.  He was informed  of the risks and benefits of the procedure and after careful  consideration elected to proceed with surgery.   HOSPITAL COURSE:  Preoperative workup was completed as an outpatient.  He was brought in through same-day surgery and underwent the  aforementioned right carotid endarterectomy.  For complete details,  please refer the typed operative report.  The procedure was without  complications.  He was returned to the Post Anesthesia Care Unit  extubated.  Following stabilization, he was transferred to a bed on a  surgical step-down unit.  He was observed overnight.  The following  morning, he was found to be nonfocal.  He has been stable overnight.  He  was desirous of discharge and was subsequently discharged home in stable  condition.       Wilmon Arms, PA      Larina Earthly, M.D.  Electronically Signed    KEL/MEDQ  D:  05/26/2009  T:  05/27/2009  Job:  [id number]   cc:   Larina Earthly, M.D.

## 2011-02-12 NOTE — Procedures (Signed)
CAROTID DUPLEX EXAM   INDICATION:  Followup right carotid endarterectomy.   HISTORY:  Diabetes:  Yes.  Cardiac:  MI.  Hypertension:  Yes.  Smoking:  Previous.  Previous Surgery:  Right carotid endarterectomy on 05/25/2009.  CV History:  Currently asymptomatic.  Amaurosis Fugax No, Paresthesias No, Hemiparesis No                                       RIGHT             LEFT  Brachial systolic pressure:         130               134  Brachial Doppler waveforms:         Normal            Normal  Vertebral direction of flow:        Not detected      Antegrade  DUPLEX VELOCITIES (cm/sec)  CCA peak systolic                   72                100  ECA peak systolic                   115               94  ICA peak systolic                   74                60  ICA end diastolic                   27                19  PLAQUE MORPHOLOGY:                  Mixed             Mixed  PLAQUE AMOUNT:                      Mild              Mild  PLAQUE LOCATION:                    CCA               ICA / bifurcation   IMPRESSION:  1. Patent right carotid endarterectomy site with no right internal      carotid artery stenosis noted.  2. No hemodynamically significant stenosis of the left internal      carotid artery with mild plaque formation noted, as described      above.  3. No significant change noted when compared to the previous exam on      12/22/2009.   ___________________________________________  Larina Earthly, M.D.   CH/MEDQ  D:  05/25/2010  T:  05/25/2010  Job:  [id number]

## 2011-02-12 NOTE — Op Note (Signed)
NAME:  Luke [name], Luke [name]                ACCOUNT NO.:  [id number]   MEDICAL RECORD NO.:  [id number]          PATIENT TYPE:  INP   LOCATION:  3311                         FACILITY:  [facility]   PHYSICIAN:  Larina Earthly, M.D.    DATE OF BIRTH:  May 23, 1929   DATE OF PROCEDURE:  05/25/2009  DATE OF DISCHARGE:                               OPERATIVE REPORT   PREOPERATIVE DIAGNOSIS:  Severe asymptomatic right internal carotid  stenosis.   POSTOPERATIVE DIAGNOSIS:  Severe asymptomatic right internal carotid  stenosis.   PROCEDURE:  Right carotid endarterectomy, resection of redundant common  carotid artery, and Dacron patch angioplasty.   SURGEON:  Larina Earthly, MD   ASSISTANT:  Wilmon Arms, PA-C   ANESTHESIA:  General endotracheal.   COMPLICATIONS:  None.   DISPOSITION:  To recovery room, stable.   PROCEDURE IN DETAIL:  The patient was taken to the operating room and  placed in the supine position where the area of the right neck was  prepped and draped in the usual sterile fashion.  An incision was made  in the anterior sternocleidomastoid and the sternocleidomastoid  reflected posteriorly and the carotid sheath was opened.  The vagus and  hypoglossal nerves were identified and preserved.  The common carotid  artery was circled with umbilical tape and Rumel tourniquet.  Dissection  was tented onto bifurcation.  Superior thyroid artery was circled to a 2-  0 silk Potts tie.  The external carotid was circled with vessel loop and  the internal carotid was encircled with a umbilical tape and Rumel  tourniquet.  With further mobilization, the internal carotid was  redundant and was concerned regarding kinking.  For this reason, the  external and internal were further mobilized as was the common.  There  was some redundancy in the external carotid, so decision was made to  mobilize this and shorten the common carotid artery.  The superior  thyroid artery was ligated and divided for  mobility and the bifurcation  was mobilized.  There was an ascending pharyngeal branch and this was  divided and was oversewn on the carotid side with a 6-0 Prolene suture  and was clipped distally.  After adequate mobilization, the patient was  given 9000 units of intravenous heparin and after adequate circulation  time, the internal, external, and common carotid arteries were occluded.  The common carotid artery was opened with 11 blade and extended  longitudinally with Potts scissors through the plaque onto the internal  carotid artery.  A 10 shunt was passed up the internal carotid, allowed  to backbleed and then down the common carotid where it was secured with  Rumel tourniquets.  The endarterectomy was begun on the common carotid  artery and the plaque was divided proximally with Potts scissors.  Endarterectomy was carried down to the bifurcation.  The external  carotid was endarterectomized with eversion technique and the internal  carotid was endarterectomized in an open fashion.  After the  endarterectomy and after mobilization, there was redundant common  carotid artery.  The 6-0 Prolene stay sutures were used to  determine the  adequate length of resection and approximately 0.5 cm of common carotid  artery were resected.  Common carotid artery was sewn end-to-end to  itself with a running 6-0 Prolene suture.  Finally, a Dacron Hemashield  Finesse patch was brought onto the field and was sewn as a patch  angioplasty with a running 6-0 Prolene suture.  Prior to completion of  the anastomosis, the shunt was removed and the usual flushing maneuvers  were undertaken.  Anastomosis was completed and flow was restored first  to the external and then the internal carotid artery.  Excellent flow  characteristics were noted with handheld Doppler in the internal and  external carotid arteries.  The patient was given 50 mg of protamine to  reverse the heparin.  There wounds were irrigated  with saline.  Hemostasis was achieved with electrocautery.  Wounds were closed with  several 3-0 Vicryl sutures, reapproximated the sternocleidomastoid over  the carotid sheath.  Next, the platysma was closed with running 3-0  Vicryl suture and finally the skin was closed with a 4-0 subcuticular  Vicryl stitch.  Benzoin and Steri-Strips were applied.  The patient was  extubated in the operating room neurologically intact and transferred to  the recovery room stable condition.      Larina Earthly, M.D.  Electronically Signed     TFE/MEDQ  D:  05/25/2009  T:  05/26/2009  Job:  [id number]

## 2011-02-12 NOTE — Procedures (Signed)
DUPLEX ULTRASOUND OF ABDOMINAL AORTA   INDICATION:  Abdominal aortic aneurysm.   HISTORY:  Diabetes:  No  Cardiac:  MI  Hypertension:  Yes  Smoking:  Previous  Connective Tissue Disorder:  Family History:  No  Previous Surgery:  No   DUPLEX EXAM:         AP (cm)                   TRANSVERSE (cm)  Proximal             2.5 cm                    2.6 cm  Mid                  2.1 cm                    2.3 cm  Distal               4.2 cm                    4.4 cm  Right Iliac          1.3 cm                    1.3 cm  Left Iliac           1.4 cm                    1.5 cm   PREVIOUS:  Date: 11/11/2008  AP:  4.05  TRANSVERSE:  4.17   IMPRESSION:  1. Aneurysm of the distal abdominal aorta with no significant change      in the maximum diameter when compared to the previous examination      on 11/11/2008.  2. Irregular plaque is noted within the aneurysmal sac.   ___________________________________________  Larina Earthly, M.D.   CH/MEDQ  D:  04/26/2009  T:  04/27/2009  Job:  [id number]

## 2011-02-12 NOTE — Assessment & Plan Note (Signed)
[facility]                          [facility]   NAME:Pack, Luke [name]                       MRN:          [id number]  DATE:02/23/2009                            DOB:          [birth year]    REFERRING PHYSICIAN:  Fara Chute   HISTORY OF PRESENT ILLNESS:  The patient is a very pleasant 77 year old  male, who has a routine followup appointment for medication refills.  The patient has known coronary artery disease with 100% occluded  proximal RCA.  He had a Cardiolite study done last year which showed  inferior apical ischemia.  The patient denies any chest pain.  He does  have stabilized chronic exertional dyspnea, but this has not worsened  compared to prior visit.  He has no orthopnea or PND.  No palpitations  or syncope.  He does have an abdominal aortic aneurysm which is followed  closely by Dr.  Arbie Cookey.   MEDICATIONS:  1. Colchicine 0.6 mg p.o. daily.  2. Lovastatin 20 mg p.o. daily.  3. __________ 50 mg 3 tablets p.o. daily.  4. Benicar 40 mg p.o. daily.  5. Glyburide 5 mg p.o. b.i.d.  6. Allopurinol 500 mg p.o. daily.  7. Hydrochlorothiazide 12.5 daily.  8. Aspirin 81 mg p.o. daily.  9. Isosorbide 60 mg p.o. daily.  10.Metformin 500 mg p.o. b.i.d.  11.Nitroglycerin p.r.n. sublingual.   PHYSICAL EXAMINATION:  VITAL SIGNS:  Blood pressure is 136/77, heart  rate is 68, weight is 198 pounds.  NECK:  Normal carotid upstroke bilaterally with loud right carotid  bruit.  LUNGS:  Diminished breath sounds bilaterally.  HEART:  Regular rate and rhythm.  Normal S1 and S2.  No murmurs, rubs,  or gallops.  ABDOMEN:  Soft, nontender.  No rebound or guarding.  Good bowel sounds.  EXTREMITIES:  No cyanosis, clubbing, or edema.  NEURO:  The patient alert and oriented, grossly nonfocal.   PROBLEM LIST:  1. Stable chronic exertional dyspnea.  2. Single-vessel coronary artery disease.      a.     Negative stress Cardiolite study 2009,  ejection fraction 61.      b.     Chronic 100% occluded proximal right coronary artery with       distal cauterization by last cath in 2005.  3. Status post possible inferior myocardial infarction, failed      percutaneous transluminal coronary angioplasty of 100% right      coronary artery occlusion in 1995 [facility].  4. New loud right carotid bruit.  5. Dyslipidemia.  6. Type 2 diabetes mellitus.  7. Hypertension.  8. Abnormal aortic aneurysm, followed by Dr. Arbie Cookey.   PLAN:  1. The patient has a new and very harsh right carotid bruit.  I      suspect he might have significant carotid artery stenosis given his      significant vascular disease.  We will refer the patient for      carotid Dopplers.  If the patient has a high-grade lesion, we will      refer him to Dr. Arbie Cookey.  2. From cardiac  standpoint, I think he is stable.  He does not need      any stress testing.  He has no angina.  3. We refilled the patient's cardiac medications today.  4. The patient will follow up with [place] in 1 year or earlier if he has      an abnormal study.      Learta Codding, MD,FACC  Electronically Signed    GED/MedQ  DD: 02/23/2009  DT: 02/24/2009  Job #: [id number]   cc:   Fara Chute

## 2011-02-12 NOTE — Procedures (Signed)
CAROTID DUPLEX EXAM   INDICATION:  Right carotid disease.   HISTORY:  Diabetes:  Yes.  Cardiac:  MI.  Hypertension:  Yes.  Smoking:  Previous.  Previous Surgery:  No.  CV History:  Currently asymptomatic.  Amaurosis Fugax No, Paresthesias No, Hemiparesis No                                       RIGHT             LEFT  Brachial systolic pressure:  Brachial Doppler waveforms:  Vertebral direction of flow:        Antegrade  DUPLEX VELOCITIES (cm/sec)  CCA peak systolic                   71  ECA peak systolic                   125  ICA peak systolic                   384  ICA end diastolic                   141  PLAQUE MORPHOLOGY:                  Mixed  PLAQUE AMOUNT:                      Moderate/severe  PLAQUE LOCATION:                    ICA/ECA/CCA   IMPRESSION:  Doppler velocities suggest an 80-99% stenosis of the right  proximal internal carotid artery.   ___________________________________________  Larina Earthly, M.D.   CH/MEDQ  D:  04/26/2009  T:  04/26/2009  Job:  [id number]

## 2011-02-12 NOTE — Procedures (Signed)
DUPLEX ULTRASOUND OF ABDOMINAL AORTA   INDICATION:  Follow-up of abdominal aortic aneurysm.   HISTORY:  Diabetes:  Yes  Cardiac:  MI  Hypertension:  Yes  Smoking:  Previous  Connective Tissue Disorder:  Family History:  No  Previous Surgery:  No   DUPLEX EXAM:         AP (cm)                   TRANSVERSE (cm)  Proximal             1.37 cm                   1.75 cm  Mid                  2.07 cm                   2.27 cm  Distal               4.24 cm                   4.27 cm  Right Iliac          1.37 cm                   1.62 cm  Left Iliac           1.52 cm                   2 cm   PREVIOUS:  Date: April 26, 2009  AP:  4.2  TRANSVERSE:  4.4   IMPRESSION:  1. Abdominal aortic aneurysm with largest measurement of 4.24 x 4.27      cm.  2. Abundant plaque is noted within aneurysm sac.   ___________________________________________  Larina Earthly, M.D.   CJ/MEDQ  D:  12/22/2009  T:  12/22/2009  Job:  [id number]

## 2011-02-12 NOTE — Procedures (Signed)
DUPLEX ULTRASOUND OF ABDOMINAL AORTA   INDICATION:  Followup abdominal aortic aneurysm.   HISTORY:  Diabetes:  Yes.  Cardiac:  MI.  Hypertension:  Yes.  Smoking:  Quit 30 years ago.  Connective Tissue Disorder:  Family History:  No.  Previous Surgery:  No.   DUPLEX EXAM:         AP (cm)                   TRANSVERSE (cm)  Proximal             2.13 cm                   2.26 cm  Mid                  2.50 cm                   2.57 cm  Distal               4.05 cm                   4.17 cm  Right Iliac          1.15 cm                   1.15 cm  Left Iliac           1.48 cm                   1.51 cm   PREVIOUS:  Date:  11/13/2007  AP:  3.65  TRANSVERSE:  3.77   IMPRESSION:  1. Abdominal aortic aneurysm shows slight increase from previous study      with largest measurement today of 4.05 cm x 4.17 cm.  2. Left common iliac artery shows focal area of ectasia measuring 1.48      cm x 1.51 cm.  3. Note:  Irregular plaque noted within aneurysm as previously      documented.  4. Dr. Arbie Cookey was informed of the results and the patient was put on 6      month protocol.   ___________________________________________  Larina Earthly, M.D.   AS/MEDQ  D:  11/11/2008  T:  11/11/2008  Job:  [id number]

## 2011-02-12 NOTE — Assessment & Plan Note (Signed)
[facility]                          [facility] NOTE   NAME:SCOTTFarah, Benish                       MRN:          [id number]  DATE:12/25/2007                            DOB:          [birth year]    PRIMARY CARDIOLOGIST:  Learta Codding, MD,FACC.   REASON FOR VISIT:  Scheduled followup.  Please refer to my recent office  note of February 20th, for full details.   I referred Mr. Moree for both an exercise stress Cardiolite, as well as  a 2-D echo, for reassessment of exertional dyspnea.  The patient had not  been seen by [place] here in the clinic since 2007 and had a known, 100%  occlusion of his proximal RCA with distal collateralization.  His last  cardiac catheterization showed no significant changes in his coronary  anatomy, with the noted chronic occlusion of the proximal RCA with left-  right collateralization.  At that time, there was non-obstructive  residual disease of the LAD, diagonal, and circumflex arteries.   His most recent stress test was an adenosine study in May 2007, which  suggested a moderate inferior defect with partial reversibility and some  residual ischemia; calculated EF 51%.   I also ordered a 2-D echo which showed continued preserved LVEF (55% -  60%), with normal contractility and akinesis of the basal inferior wall.  There were no significant valvular abnormalities.   The repeat stress test suggested a medium, completely reversible  apical/inferobasal defect consistent with ischemia.  Left ventricular  function was normal (EF 61%), with normal wall motion.   In comparing this result with his previous stress test in 2007, however,  it was felt that this did not represent any significant change and was  consistent with the known occlusion of his proximal RCA.   Clinically, Mr. Rosemond continues to perform his various activities on his  property, including tending to his dogs and then walking back up the  hill to his  house.  He confirms to me that he does have significant  exertional dyspnea, if he walks too briskly, yet with no associated  chest discomfort.  Walking at a regular pace, however, does not induce  any significant dyspnea.  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   I also requested his most recent lipid profile and this showed excellent  LDL control (67); however, triglycerides (206) and HDL (34) were less  than ideal.  The patient has been on low-dose lovastatin for several  years.   CURRENT MEDICATIONS:  Unchanged from recent office visit.   PHYSICAL EXAMINATION:  VITAL SIGNS:  Blood pressure 154/79, pulse 69,  Weight 207.  GENERAL:  A 77 year old male, sitting upright, no distress.  HEENT:  Normocephalic, atraumatic.  NECK:  Palpable bilateral aortic pulses without bruits; no JVD.  LUNGS:  Clear to auscultation in all fields.  HEART:  Regular rate and rhythm (S1, S2), no significant murmurs.  ABDOMEN:  Protuberant, nontender.  EXTREMITIES:  No significant edema.  NEURO:  No focal deficit pressure.   IMPRESSION:  1. Stable, chronic exertional dyspnea.  2. Known, severe single vessel coronary artery disease.      a.     Recent, stable adenosine stress Cardiolite with previously       documented inferior apical ischemia; EF 61%.      b.     Chronic 100% occluded proximal RCA with distal       collateralization, by last cardiac catheterization in 2005.      c.     Status post OOH inferior myocardial infarction/failed PTCA       of 100% right coronary artery  in 1995 [facility]).  3. Dyslipidemia.  4. Mixed dyslipidemia.      a.     Elevated triglycerides/low HDL.  5. Type 2 diabetes mellitus.  6. Hypertension.  7. Abdominal artic aneurysm, followed by Dr. Gretta Began in [place].   PLAN:  1. Continue medical therapy, given the patient is not reporting any      development of exertional angina pectoris.  Moreover, his most      recent  stress test appears unchanged from the previous study in      2007.  Left ventricular function also remains preserved, as      indicated both by his recent perfusion imaging study as well as 2-D      echocardiography.  We will, however, continue close monitoring and      keep a low threshold for repeat cardiac catheterization, in the      event that he develops any exertional angina pectoris.  2. Adjust medical regimen with up-titration of Imdur to 240 mg daily.      This is not so much for treatment of any chest pain, which he is      not currently reporting, but so as to try to increase myocardial      perfusion, given his known underlying disease.  3. Start fish oil 1 gram b.i.d. for treatment of mixed dyslipidemia,      with particular emphasis on his elevated triglycerides and low HDL      level.  He will need a follow-up FLP/liver profile in 12 weeks.  4. Schedule return clinic followup with myself and Dr. Andee Lineman in 3      months for continued close monitoring and further recommendations.      Gene Serpe, PA-C  Electronically Signed      Learta Codding, MD,FACC  Electronically Signed   GS/MedQ  DD: 12/25/2007  DT: 12/26/2007  Job #: [id number]   cc:   Fara Chute

## 2011-02-15 NOTE — Assessment & Plan Note (Signed)
[facility]                            [facility]   NAME:Luke [name], Luke [name]                       MRN:          [id number]  DATE:04/10/2006                            DOB:          [birth year]    PRIMARY CARE:  Dr. Fara Chute.   Luke [name] is a very pleasant 77 year old Caucasian gentleman with a history  of coronary artery disease with an occluded right coronary artery with left  and right collaterals.  The patient states he has had recurrent chest pain  which is left-sided with radiation to the left neck and left arm, not really  associated with any activity.  He has had several episodes over the last few  months.  He saw Dr. Andee Lineman back in May for above complaint, was referred for  a stress Myoview.  The patient underwent Myoview stress on Feb 27, 2006.  Perfusion data demonstrated a moderate-sized inferior defect with partial  reversibility suggestive of prior infarct or scarring and some residual  ischemia.  The gated left ventricular ejection fraction was 64%.  Overall  this represented a low-risk myocardial perfusion study.  The patient returns  today to discuss results of exam.  He is still complaining of the  intermittent chest discomfort symptoms as stated above.  The patient  describes it as more of a sore sensation.  He does however continue to walk  two miles a day without any dyspnea on exertion or any chest pain during his  walk.  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  When asked  about his truck, he states there is a lot of jarring on the road.  He also  is using his arms to pull up and get into the cab.  He is concerned maybe  his discomfort is more musculoskeletal than cardiac and actually mentioned  to Dr. Neita Carp his chest discomfort.  It was brought up at one point the  possibility of bursitis and possibly receiving an injection of cortisone to  see if there was any relief.   PAST MEDICAL HISTORY:  1.  Coronary artery disease.  Cardiac history dates back to 1995 when he      suffered an inferior wall myocardial infarction.  Cardiac      catheterization showed right coronary artery completely occluded and      PTCA was attempted but unsuccessful.  The patient had no evidence of      significant other coronary artery disease.  His left ventricular      function has been within normal limits.  2.  GERD.  3.  Prior history of tobacco use.  4.  Type-2 diabetes.  5.  Hyperlipidemia.  6.  Hypertension.  7.  History of gout.   CURRENT MEDICATIONS:  1.  Allopurinol 300 mg daily.  2.  Colchicine 0.6 mg daily.  3.  Aspirin 81 mg daily.  4.  Glyburide/metformin 5/500 b.i.d.  5.  Atenolol 50 mg t.i.d.  6.  Benicar 40 mg daily.  7.  Lovastatin 20 mg daily.  8.  Actos 15 mg  daily.   ALLERGIES:  NO KNOWN DRUG ALLERGIES.   REVIEW OF SYSTEMS:  As stated above in history of illness, otherwise  negative.   PHYSICAL EXAMINATION:  Weight 201 pounds, blood pressure 112/60, heart rate  64.  The patient is a 77 year old Caucasian gentleman in no acute distress,  very pleasant and cooperative gentleman.  Normocephalic, atraumatic.  Pupils  equal, round and reactive to light.  Sclerae clear.  NECK:  Supple without lymphadenopathy, negative bruit, negative JVD.  CHEST:  Clear to auscultation bilaterally.  CARDIAC:  S1 and S2, regular rate and rhythm.  ABDOMEN:  Soft, nontender.  LOWER EXTREMITIES:  Without clubbing, cyanosis or edema.  The patient had 2+  DPs bilateral.   IMPRESSION:  Chest pain, somewhat atypical features.  Recently started on  Imdur 30 mg without change in chest discomfort.  Status post recent nuclear  Myoview showing low-risk probability.  The plan at this time is to continue  current medications.  Will have patient follow up with Dr. Neita Carp for  cortisone injection.  If no relief in chest discomfort, the patient is to  call our office for a followup appointment.  At that  time, we will either:  1) increase his Imdur, or 2) proceed with cardiac catheterization if patient  wishes.  My impression is the patient would proceed with cardiac  catheterization as he is anxious to find out what is causing his chest  discomfort.  I have also told him if it becomes intense or the patient  becomes more symptomatic, he is to call 9-1-1 or seek immediate medical  assistance.  Otherwise, he will continue with current medical regimen and  follow as needed.                                   Dorian Pod, ACNP                                Learta Codding, MD, [facility]   MB/MedQ  DD:  04/10/2006  DT:  04/10/2006  Job #:  [id number]   cc:   Fara Chute

## 2011-02-15 NOTE — H&P (Signed)
NAME:  Luke [name], Luke [name] NO.:  [id number]   MEDICAL RECORD NO.:  [id number]                  PATIENT TYPE:   LOCATION:                                       FACILITY:   PHYSICIAN:  Dalia Heading, M.D.               DATE OF BIRTH:  May 08, 1929   DATE OF ADMISSION:  DATE OF DISCHARGE:                                HISTORY & PHYSICAL   CHIEF COMPLAINT:  Lower abdominal pain of unknown etiology.   HISTORY OF PRESENT ILLNESS:  The patient is a 77 year old white male who is  referred for evaluation and treatment of lower abdominal pain.  He states  that he occasionally has lower abdominal pain which resolves on its own.  He  has a known history of a small aortic aneurysm which has not changed in size  with time as per a recent CT scan.  He denies any lightheadedness, weight  loss, fever, constipation, diarrhea, hematochezia, or melena.  He denies  hemorrhoidal problems.  There is no family history of colon carcinoma.  He  has never had a colonoscopy.   PAST MEDICAL HISTORY:  1. Gout.  2. Non-insulin dependent diabetes mellitus.  3. Hypertension.   PAST SURGICAL HISTORY:  Unremarkable.   CURRENT MEDICATIONS:  1. Allopurinol 300 mg p.o. daily.  2. Glucovance 2.5/500 mg p.o. b.i.d.  3. Atenolol 50 mg p.o. t.i.d.  4. Colchicine 0.6 mg p.o. daily.  5. Baby aspirin daily.   ALLERGIES:  No known drug allergies.   REVIEW OF SYSTEMS:  The patient had an MI in 1995.  He has had no further  episodes of angina, chest pain, shortness of breath, leg swelling, CVA, or  bleeding disorders.   PHYSICAL EXAMINATION:  GENERAL:  The patient is a well-developed, well-  nourished white male in no acute distress.  VITAL SIGNS:  He is afebrile and vital signs are stable.  LUNGS:  Clear to auscultation with equal breath sounds bilaterally.  HEART:  Regular rate and rhythm without S3, S4, or murmurs.  ABDOMEN:  Soft, nontender, nondistended.  No pulsatile masses  noted.  Diastasis recti is noted.  No hernias are noted.  RECTAL:  Examination was deferred to the procedure.   IMPRESSION:  Abdominal pain of unknown etiology.   PLAN:  The patient is scheduled for a colonoscopy on December 23, 2002.  The  risks and benefits of the procedure including bleeding and perforation were  fully explained to the patient, gave informed consent.                                               Dalia Heading, M.D.    MAJ/MEDQ  D:  12/09/2002  T:  12/09/2002  Job:  [id number]   cc:  Fara Chute  [address] [place]  [place] [address]  Fax: [phone]

## 2011-02-15 NOTE — Cardiovascular Report (Signed)
NAME:  Luke [name], Luke [name]                          ACCOUNT NO.:  [id number]   MEDICAL RECORD NO.:  [id number]                   PATIENT TYPE:  OIB   LOCATION:  2899                                 FACILITY:  [facility]   PHYSICIAN:  Charlton Haws, M.D.                  DATE OF BIRTH:  [birth year]   DATE OF PROCEDURE:  10/21/2003  DATE OF DISCHARGE:                              CARDIAC CATHETERIZATION   INDICATION:  A 77 year old patient with history of occluded right coronary  artery, increasing chest pain and dyspnea.   PROCEDURE:  Standard catheterization was done from the right femoral artery  with 4 French catheters.  With 4 French catheters, we were unable to cross  the aortic valve even with a stiff J-wire.  I do not think there is any  significant aortic stenosis, but the catheters were only 4 [place].   Native left main coronary artery was normal.   Left anterior descending artery was somewhat calcified proximally.  There  were 20-30% multiple discrete lesions in the mid portion.   First diagonal branch had 20% multiple discrete lesion.   Second diagonal branch had 30% ostial lesion.   The circumflex coronary artery had 30-40% tubular disease in the mid vessel.   There were left-to-right collaterals to the distal PLA and PDA.   The right coronary artery was 100% occluded proximally.  This is an old  lesion and did not look any different from his previous cath.   IMPRESSION:  The patient has an old total occlusion of  the right coronary  artery with collaterals.  He does not have any significant left coronary  artery disease.   Continued medical therapy is warranted.  Tight control of his diabetes  including close followup of his hemoglobin A1c would be warranted.   The patient will continue his beta blocker and ACE inhibitor therapy.   Of note, I do not see that he is on a statin drug and followup lipids may be  in order.                                               Charlton Haws, M.D.    PN/MEDQ  D:  10/21/2003  T:  10/21/2003  Job:  [id number]   cc:   Fara Chute  [address] [place]  [place] [address]  Fax: [phone]   Jonelle Sidle, M.D. [facility]   [facility] in [place]

## 2011-05-28 ENCOUNTER — Encounter: Payer: Self-pay | Admitting: *Deleted

## 2011-05-28 ENCOUNTER — Ambulatory Visit (INDEPENDENT_AMBULATORY_CARE_PROVIDER_SITE_OTHER): Payer: Medicare Other | Admitting: Cardiology

## 2011-05-28 VITALS — BP 138/84 | HR 65 | Ht 70.0 in | Wt 189.0 lb

## 2011-05-28 DIAGNOSIS — E785 Hyperlipidemia, unspecified: Secondary | ICD-10-CM

## 2011-05-28 DIAGNOSIS — I714 Abdominal aortic aneurysm, without rupture: Secondary | ICD-10-CM

## 2011-05-28 DIAGNOSIS — R0602 Shortness of breath: Secondary | ICD-10-CM

## 2011-05-28 DIAGNOSIS — I2510 Atherosclerotic heart disease of native coronary artery without angina pectoris: Secondary | ICD-10-CM

## 2011-05-28 NOTE — Patient Instructions (Signed)
Continue all current medications. Your physician wants you to follow up in: 6 months.  You will receive a reminder letter in the mail one-two months in advance.  If you don't receive a letter, please call our office to schedule the follow up appointment   

## 2011-05-28 NOTE — Assessment & Plan Note (Signed)
Followed by vascular surgery. 

## 2011-05-28 NOTE — Assessment & Plan Note (Signed)
Coronary artery disease continue risk factor modification: Normal LV size and contraction with no wall motion abnormalities

## 2011-05-28 NOTE — Progress Notes (Signed)
HPI The patient is a 77 year old male with history of coronary arteries, 100% occluded proximal right coronary artery but no significant disease in the left system., AAA is status post carotid endarterectomy.  The patient reports chronic fatigue lack of energy, feeling easily short of breath. However over the last several years he has curtailed his exercise and doesn't really walk anymore. He still does work in the yard. He denies however any chest tightness orthopnea or PND. He apparently had blood work done recently by his primary care physician everything was within normal limits. Although he initially had lost some weight and his weight is now back up again and feels a little bit improvement in his stamina. The patient appears pale and had some concerns about anemia last time reportedly he was reassured by his primary care physician several weeks ago but his blood counts were normal. The patient is closely followed by vascular surgery for his AAA and carotid disease. He recently saw him in consultation. Of note is that the patient's stress test done several months ago which showed a defect in the inferior wall this is what is occluded RCA. We decided to treat him medically. A bedside echocardiogram today was performed and there were no wall motion abnormalities and his ejection fraction was 65%. There was some mild aortic valve calcification but otherwise a normal mitral valve. Right heart structures also appear to be within normal limits.  No Known Allergies  Current Outpatient Prescriptions on File Prior to Visit  Medication Sig Dispense Refill  . losartan (COZAAR) 50 MG tablet TAKE 1 TABLET BY MOUTH ONCE A DAY  90 tablet  3    Past Medical History  Diagnosis Date  . Abdominal aortic aneurysm     4.4 x 4.4 cm August 2011  . Hypertension   . Hyperlipidemia   . Bruit   . CAD (coronary artery disease)   . Dyspnea   . Type 2 diabetes mellitus     Past Surgical History  Procedure Date  .  Carotid endarterectomy 04/2010    Dr. Langston Masker no stenosis of the left internal carotid artery and patent right internal carotid artery    No family history on file.  History   Social History  . Marital Status: Married    Spouse Name: N/A    Number of Children: N/A  . Years of Education: N/A   Occupational History  . Not on file.   Social History Main Topics  . Smoking status: Former Smoker -- 2.0 packs/day for 30 years    Types: Cigarettes    Quit date: 10/27/1976  . Smokeless tobacco: Never Used  . Alcohol Use: Not on file  . Drug Use: Not on file  . Sexually Active: Not on file   Other Topics Concern  . Not on file   Social History Narrative  . No narrative on file   RUE:[id number] positives as outlined above. The remainder of the 18  point review of systems is negative  PHYSICAL EXAM BP 138/84  Pulse 65  Ht 5\' 10"  (1.778 m)  Wt 189 lb (85.73 kg)  BMI 27.12 kg/m2  General: Well-developed, well-nourished in no distress Head: Normocephalic and atraumatic Eyes:PERRLA/EOMI intact, conjunctiva and lids normal Ears: No deformity or lesions Mouth:normal dentition, normal posterior pharynx Neck: Supple, no JVD.  No masses, thyromegaly or abnormal cervical nodes Lungs: Normal breath sounds bilaterally without wheezing.  Normal percussion Cardiac: regular rate and rhythm with normal S1 and S2, no S3 or S4.  PMI is normal.  No pathological murmurs Abdomen: Normal bowel sounds, abdomen is soft and nontender without masses, organomegaly or hernias noted.  No hepatosplenomegaly MSK: Back normal, normal gait muscle strength and tone normal Vascular: Pulse is normal in all 4 extremities Extremities: No peripheral pitting edema Neurologic: Alert and oriented x 3 Skin: Intact without lesions or rashes Lymphatics: No significant adenopathy Psychologic: Normal affect  ECG: Not available  ASSESSMENT AND PLAN

## 2011-05-28 NOTE — Assessment & Plan Note (Signed)
Continue risk factor modification 

## 2011-05-28 NOTE — Assessment & Plan Note (Signed)
Chronic dyspnea likely secondary to deconditioning: Encouraged patient to start walking again one or 2 miles every other day.

## 2011-05-31 DIAGNOSIS — R079 Chest pain, unspecified: Secondary | ICD-10-CM

## 2011-06-05 ENCOUNTER — Encounter: Payer: Self-pay | Admitting: Vascular Surgery

## 2011-06-11 ENCOUNTER — Ambulatory Visit: Payer: Medicare Other | Admitting: Vascular Surgery

## 2011-06-11 ENCOUNTER — Other Ambulatory Visit: Payer: Medicare Other

## 2011-06-25 ENCOUNTER — Ambulatory Visit (INDEPENDENT_AMBULATORY_CARE_PROVIDER_SITE_OTHER): Payer: Medicare Other | Admitting: Cardiology

## 2011-06-25 ENCOUNTER — Encounter: Payer: Self-pay | Admitting: Cardiology

## 2011-06-25 VITALS — BP 146/77 | HR 60 | Ht 70.0 in | Wt 189.0 lb

## 2011-06-25 DIAGNOSIS — E785 Hyperlipidemia, unspecified: Secondary | ICD-10-CM

## 2011-06-25 DIAGNOSIS — I2510 Atherosclerotic heart disease of native coronary artery without angina pectoris: Secondary | ICD-10-CM

## 2011-06-25 DIAGNOSIS — I10 Essential (primary) hypertension: Secondary | ICD-10-CM

## 2011-06-25 DIAGNOSIS — N189 Chronic kidney disease, unspecified: Secondary | ICD-10-CM | POA: Insufficient documentation

## 2011-06-25 DIAGNOSIS — I779 Disorder of arteries and arterioles, unspecified: Secondary | ICD-10-CM | POA: Insufficient documentation

## 2011-06-25 DIAGNOSIS — I714 Abdominal aortic aneurysm, without rupture: Secondary | ICD-10-CM

## 2011-06-25 NOTE — Assessment & Plan Note (Signed)
Patient has followup appointment scheduled both for his aneurysm and carotid artery disease with vascular surgery.

## 2011-06-25 NOTE — Assessment & Plan Note (Signed)
Blood pressure borderline. I told the patient to hold off on Celebrex and use Tylenol when necessary

## 2011-06-25 NOTE — Assessment & Plan Note (Signed)
Recent hospitalization with atypical chest pain. Ruled out for myocardial infarction. Continue medical therapy

## 2011-06-25 NOTE — Progress Notes (Signed)
HPI The patient was recently hospitalized with atypical chest pain. This is felt to be noncardiac. He ruled out for myocardial infarction. The patient has known coronary artery disease with occluded right coronary artery with left to right collaterals. Last catheterization was in 2005. The patient reports of some chest congestion but no shortness of breath orthopnea PND. He has a postnasal drip. He reports a cough at night. From a cardiac standpoint has remained stable.  No Known Allergies  Current Outpatient Prescriptions on File Prior to Visit  Medication Sig Dispense Refill  . allopurinol (ZYLOPRIM) 300 MG tablet Take 1 tablet by mouth Daily.      Marland Kitchen aspirin 81 MG chewable tablet Chew 81 mg by mouth daily.        Marland Kitchen atenolol (TENORMIN) 50 MG tablet Take 3 tablets by mouth Daily.      . CELEBREX 200 MG capsule Take 1 capsule by mouth Daily.      Marland Kitchen COLCRYS 0.6 MG tablet Take 1 tablet by mouth Every 1 hour. Until gout relieves      . fish oil-omega-3 fatty acids 1000 MG capsule Take 1 g by mouth daily.        Marland Kitchen glyBURIDE (DIABETA) 5 MG tablet Take 1 tablet by mouth Twice daily.      . hydrochlorothiazide (,MICROZIDE/HYDRODIURIL,) 12.5 MG capsule Take 1 capsule by mouth Daily.      . isosorbide mononitrate (IMDUR) 60 MG 24 hr tablet Take 1 tablet by mouth Daily.      Marland Kitchen losartan (COZAAR) 50 MG tablet TAKE 1 TABLET BY MOUTH ONCE A DAY  90 tablet  3  . lovastatin (MEVACOR) 20 MG tablet Take 1 tablet by mouth Daily.      . metFORMIN (GLUCOPHAGE) 1000 MG tablet Take 1 tablet by mouth Twice daily.      Marland Kitchen olmesartan (BENICAR) 40 MG tablet Take 40 mg by mouth daily.        . pioglitazone (ACTOS) 15 MG tablet Take 15 mg by mouth daily.          Past Medical History  Diagnosis Date  . Abdominal aortic aneurysm     4.4 x 4.4 cm August 2011  . Hypertension   . Hyperlipidemia   . Bruit   . CAD (coronary artery disease)   . Dyspnea   . Type 2 diabetes mellitus   . Diabetes mellitus Age 77  .  Myocardial infarction 1995  . Gout   . Carotid artery occlusion     Past Surgical History  Procedure Date  . Carotid endarterectomy 04/2009    Dr. Langston Masker no stenosis of the left internal carotid artery and patent right internal carotid artery    Family History  Problem Relation Age of Onset  . Heart disease Mother     History   Social History  . Marital Status: Married    Spouse Name: N/A    Number of Children: N/A  . Years of Education: N/A   Occupational History  . Not on file.   Social History Main Topics  . Smoking status: Former Smoker -- 2.0 packs/day for 30 years    Types: Cigarettes    Quit date: 10/27/1976  . Smokeless tobacco: Never Used  . Alcohol Use: No  . Drug Use: No  . Sexually Active: Not on file   Other Topics Concern  . Not on file   Social History Narrative  . No narrative on file   review of systems:Pertinent positives as  outlined above. The remainder of the 18  point review of systems is negative  PHYSICAL EXAM BP 146/77  Pulse 60  Ht 5\' 10"  (1.778 m)  Wt 189 lb (85.73 kg)  BMI 27.12 kg/m2  General: Well-developed, well-nourished in no distress Head: Normocephalic and atraumatic Eyes:PERRLA/EOMI intact, conjunctiva and lids normal Ears: No deformity or lesions Mouth:normal dentition, normal posterior pharynx Neck: Supple, no JVD.  No masses, thyromegaly or abnormal cervical nodes Lungs: Normal breath sounds bilaterally without wheezing.  Normal percussion Cardiac: regular rate and rhythm with normal S1 and S2, no S3 or S4.  PMI is normal.  No pathological murmurs Abdomen: Normal bowel sounds, abdomen is soft and nontender without masses, organomegaly or hernias noted.  No hepatosplenomegaly MSK: Back normal, normal gait muscle strength and tone normal Vascular: Pulse is normal in all 4 extremities Extremities: No peripheral pitting edema Neurologic: Alert and oriented x 3 Skin: Intact without lesions or rashes Lymphatics: No  significant adenopathy Psychologic: Normal affect  ECG: Not available  ASSESSMENT AND PLAN

## 2011-06-25 NOTE — Patient Instructions (Signed)
Continue all current medications. Your physician wants you to follow up in: 6 months.  You will receive a reminder letter in the mail one-two months in advance.  If you don't receive a letter, please call our office to schedule the follow up appointment   

## 2011-06-25 NOTE — Assessment & Plan Note (Signed)
Followed by his primary care physician. 

## 2011-07-03 ENCOUNTER — Other Ambulatory Visit: Payer: Self-pay | Admitting: *Deleted

## 2011-07-03 MED ORDER — ISOSORBIDE MONONITRATE ER 60 MG PO TB24: 60.0000 mg | ORAL_TABLET | Freq: Every day | ORAL | Status: DC

## 2011-07-16 ENCOUNTER — Encounter (INDEPENDENT_AMBULATORY_CARE_PROVIDER_SITE_OTHER): Payer: Medicare Other | Admitting: Vascular Surgery

## 2011-07-16 ENCOUNTER — Other Ambulatory Visit (INDEPENDENT_AMBULATORY_CARE_PROVIDER_SITE_OTHER): Payer: Medicare Other | Admitting: Vascular Surgery

## 2011-07-16 ENCOUNTER — Ambulatory Visit: Payer: Medicare Other | Admitting: Vascular Surgery

## 2011-07-16 DIAGNOSIS — Z48812 Encounter for surgical aftercare following surgery on the circulatory system: Secondary | ICD-10-CM

## 2011-07-16 DIAGNOSIS — I714 Abdominal aortic aneurysm, without rupture: Secondary | ICD-10-CM

## 2011-07-16 DIAGNOSIS — I6529 Occlusion and stenosis of unspecified carotid artery: Secondary | ICD-10-CM

## 2011-07-16 NOTE — Progress Notes (Unsigned)
AAA U/S performed @[facility]  07/16/2011

## 2011-07-16 NOTE — Progress Notes (Unsigned)
Carotid done 07/16/2011

## 2011-07-23 NOTE — Procedures (Unsigned)
CAROTID DUPLEX EXAM  INDICATION:  Followup carotid stenosis.  HISTORY: Diabetes:  Yes. Cardiac:  MI. Hypertension:  Yes. Smoking:  Previous. Previous Surgery:  Right carotid endarterectomy on 05/01/2009. CV History:  Asymptomatic. Amaurosis Fugax No, Paresthesias No, Hemiparesis No                                      RIGHT             LEFT Brachial systolic pressure:         132               128 Brachial Doppler waveforms:         WNL               WNL Vertebral direction of flow:        Antegrade         Antegrade DUPLEX VELOCITIES (cm/sec) CCA peak systolic                   50                95 ECA peak systolic                   71                79 ICA peak systolic                   57                67 ICA end diastolic                   21                25 PLAQUE MORPHOLOGY:                  N/V               Heterogeneous PLAQUE AMOUNT:                      N/V               Mild PLAQUE LOCATION:                    N/V               CCA/ICA  IMPRESSION: 1. Right internal carotid artery is patent with history of     endarterectomy, no restenosis or hyperplasia is identified. 2. Left internal carotid artery stenosis in the 1%-39% range. 3. Patent and antegrade bilateral vertebral arteries. 4. Unchanged since previous study on 12/04/2010.  ___________________________________________ Larina Earthly, M.D.  SH/MEDQ  D:  07/16/2011  T:  07/16/2011  Job:  [id number]

## 2011-07-23 NOTE — Procedures (Unsigned)
DUPLEX ULTRASOUND OF ABDOMINAL AORTA  INDICATION:  Followup abdominal aortic aneurysm.  HISTORY: Diabetes:  Yes. Cardiac:  MI. Hypertension:  Yes. Smoking:  Previous. Connective Tissue Disorder: Family History:  No. Previous Surgery:  No aortic intervention.  DUPLEX EXAM:         AP (cm)                   TRANSVERSE (cm) Proximal             2.37 cm                   2.57 cm Mid                  2.49 cm                   2.65 cm Distal               5.25 cm                   5.23 cm Right Iliac          1.03-P/2.14-D             1.03-P/2.22-D Left Iliac           1.08 cm                   1.22 cm  PREVIOUS:  Date:  12/04/2010  AP:  4.7  TRANSVERSE:  4.6  IMPRESSION: 1. Aneurysmal dilatation of the distal aorta measuring 5.25 cm x 5.23     cm with intramural thrombus and heterogeneous plaque present. 2. Right distal common iliac artery aneurysm measuring 2.14 cm x 2.22     cm in diameter with intramural thrombus and heterogeneous plaque     present. 3. This is an increase in diameter since previous study on 12/04/2010     with new finding of iliac artery aneurysm.  ___________________________________________ Larina Earthly, M.D.  SH/MEDQ  D:  07/16/2011  T:  07/16/2011  Job:  [id number]

## 2011-07-26 ENCOUNTER — Encounter: Payer: Self-pay | Admitting: Vascular Surgery

## 2011-07-29 ENCOUNTER — Encounter: Payer: Self-pay | Admitting: Vascular Surgery

## 2011-07-29 ENCOUNTER — Other Ambulatory Visit: Payer: Self-pay | Admitting: Vascular Surgery

## 2011-07-29 DIAGNOSIS — I714 Abdominal aortic aneurysm, without rupture: Secondary | ICD-10-CM

## 2011-07-29 DIAGNOSIS — I6529 Occlusion and stenosis of unspecified carotid artery: Secondary | ICD-10-CM

## 2011-07-29 DIAGNOSIS — Z48812 Encounter for surgical aftercare following surgery on the circulatory system: Secondary | ICD-10-CM

## 2011-07-30 ENCOUNTER — Other Ambulatory Visit: Payer: Self-pay | Admitting: Vascular Surgery

## 2011-07-30 ENCOUNTER — Encounter: Payer: Self-pay | Admitting: Vascular Surgery

## 2011-07-30 ENCOUNTER — Ambulatory Visit (INDEPENDENT_AMBULATORY_CARE_PROVIDER_SITE_OTHER): Payer: Medicare Other | Admitting: Vascular Surgery

## 2011-07-30 VITALS — BP 164/83 | HR 60 | Resp 16 | Ht 70.0 in | Wt 190.0 lb

## 2011-07-30 DIAGNOSIS — Z9889 Other specified postprocedural states: Secondary | ICD-10-CM

## 2011-07-30 DIAGNOSIS — I714 Abdominal aortic aneurysm, without rupture, unspecified: Secondary | ICD-10-CM

## 2011-07-30 DIAGNOSIS — I6529 Occlusion and stenosis of unspecified carotid artery: Secondary | ICD-10-CM

## 2011-07-30 NOTE — Progress Notes (Signed)
The patient presents today for continued followup of his infrarenal abdominal aortic aneurysm. Since been followed with serial ultrasounds over several years. He continues to have no symptoms referable to his aneurysm. As a history of prior myocardial infarction diabetes hypertension he is a previous cigarette smoker. He had a prior uneventful carotid endarterectomy has had no neurologic deficits  Past Medical History  Diagnosis Date  . Abdominal aortic aneurysm     4.4 x 4.4 cm August 2011  . Hypertension   . Hyperlipidemia   . Bruit   . CAD (coronary artery disease)   . Dyspnea   . Type 2 diabetes mellitus   . Diabetes mellitus Age 77  . Myocardial infarction 1995  . Gout   . Carotid artery occlusion     History  Substance Use Topics  . Smoking status: Former Smoker -- 2.0 packs/day for 30 years    Types: Cigarettes    Quit date: 10/27/1976  . Smokeless tobacco: Never Used  . Alcohol Use: No    Family History  Problem Relation Age of Onset  . Heart disease Mother     No Known Allergies  Current outpatient prescriptions:allopurinol (ZYLOPRIM) 300 MG tablet, Take 1 tablet by mouth Daily., Disp: , Rfl: ;  aspirin 81 MG chewable tablet, Chew 81 mg by mouth daily.  , Disp: , Rfl: ;  atenolol (TENORMIN) 50 MG tablet, Take 3 tablets by mouth Daily., Disp: , Rfl: ;  COLCRYS 0.6 MG tablet, Take 1 tablet by mouth Every 1 hour. Until gout relieves, Disp: , Rfl:  fish oil-omega-3 fatty acids 1000 MG capsule, Take 1 g by mouth daily.  , Disp: , Rfl: ;  glyBURIDE (DIABETA) 5 MG tablet, Take 1 tablet by mouth Twice daily., Disp: , Rfl: ;  hydrochlorothiazide (,MICROZIDE/HYDRODIURIL,) 12.5 MG capsule, Take 1 capsule by mouth Daily., Disp: , Rfl: ;  isosorbide mononitrate (IMDUR) 60 MG 24 hr tablet, Take 1 tablet (60 mg total) by mouth daily., Disp: 30 tablet, Rfl: 6 losartan (COZAAR) 50 MG tablet, TAKE 1 TABLET BY MOUTH ONCE A DAY, Disp: 90 tablet, Rfl: 3;  lovastatin (MEVACOR) 20 MG tablet,  Take 1 tablet by mouth Daily., Disp: , Rfl: ;  metFORMIN (GLUCOPHAGE) 1000 MG tablet, Take 1 tablet by mouth Twice daily., Disp: , Rfl: ;  NITROSTAT 0.4 MG SL tablet, , Disp: , Rfl: ;  olmesartan (BENICAR) 40 MG tablet, Take 40 mg by mouth daily.  , Disp: , Rfl:  pioglitazone (ACTOS) 15 MG tablet, Take 15 mg by mouth daily.  , Disp: , Rfl: ;  CELEBREX 200 MG capsule, Take 1 capsule by mouth Daily., Disp: , Rfl:   BP 164/83  Pulse 60  Resp 16  Ht 5\' 10"  (1.778 m)  Wt 190 lb (86.183 kg)  BMI 27.26 kg/m2  SpO2 98%  Body mass index is 27.26 kg/(m^2).       Review of systems no change.  Physical exam well-developed well-nourished white male in no acute distress. HEENT normal. Heart regular rate and rhythm without murmur. Chest clear bilaterally. Abdomen soft nontender no masses noted does have a prominent aortic pulsation is nontender over his aorta. 2+ radial and femoral pulses bilaterally he is grossly intact neurologically.  Ultrasound of abdominal aortic aneurysm from 07/16/2011: Increased to mention up to 5.2 cm from the prior study 6 months ago 4.7 cm.  Impression and plan: 5 mm expansion and 6 months of his infrarenal abdominal aortic aneurysm. I have recommended CT scan for  further evaluation to determine if continued observation versus potential treatment is warranted. Patient will see me again following CT scan in 1-2 weeks

## 2011-08-05 ENCOUNTER — Encounter: Payer: Self-pay | Admitting: Vascular Surgery

## 2011-08-06 ENCOUNTER — Encounter: Payer: Self-pay | Admitting: Vascular Surgery

## 2011-08-06 ENCOUNTER — Ambulatory Visit (INDEPENDENT_AMBULATORY_CARE_PROVIDER_SITE_OTHER): Payer: Medicare Other | Admitting: Vascular Surgery

## 2011-08-06 ENCOUNTER — Ambulatory Visit
Admission: RE | Admit: 2011-08-06 | Discharge: 2011-08-06 | Disposition: A | Discharge status: Home / Self Care | Payer: Medicare Other | Source: Ambulatory Visit | Attending: Vascular Surgery | Admitting: Vascular Surgery

## 2011-08-06 VITALS — BP 160/84 | HR 103 | Resp 20 | Ht 70.0 in | Wt 190.0 lb

## 2011-08-06 DIAGNOSIS — I714 Abdominal aortic aneurysm, without rupture: Secondary | ICD-10-CM

## 2011-08-06 MED ORDER — IOHEXOL 350 MG/ML SOLN
75.0000 mL | Freq: Once | INTRAVENOUS | Status: AC | PRN
  Administered 2011-08-06: 75 mL via INTRAVENOUS

## 2011-08-06 NOTE — Progress Notes (Signed)
The patient presents today for continued discussion of his abdominal aortic aneurysm. I had seen him 2 weeks ago with an ultrasound showing expansion. I have reviewed his CAT scan images and discuss these at length with the patient and his wife present. This does show extremely irregular infrarenal abdominal aortic aneurysm with maximal diameter at least 5.5 cm. He does have some ectasia and irregularity of his infrarenal aorta but does have an acceptable neck for sealing. He also has some aneurysmal changes into his iliac arteries and may require extension into his external iliac. I explained all this to the patient. His physical exam is otherwise unchanged. I explained that I will review his films with the device manufacturer for sizing and feel that he would be a good candidate for stent graft replacement. Once we have determined that this he will be scheduled for elective surgery. He reports that he recently had a extensive workup with a his cardiologist in even with no evidence of cardiac disease. We will confirm this.

## 2011-08-26 ENCOUNTER — Other Ambulatory Visit: Payer: Self-pay | Admitting: *Deleted

## 2011-08-28 ENCOUNTER — Encounter (HOSPITAL_COMMUNITY): Payer: Self-pay | Admitting: Pharmacy Technician

## 2011-09-04 ENCOUNTER — Other Ambulatory Visit: Payer: Self-pay

## 2011-09-04 ENCOUNTER — Encounter (HOSPITAL_COMMUNITY)
Admission: RE | Admit: 2011-09-04 | Discharge: 2011-09-04 | Disposition: A | Discharge status: Home / Self Care | Payer: Medicare Other | Source: Ambulatory Visit | Attending: Anesthesiology | Admitting: Anesthesiology

## 2011-09-04 ENCOUNTER — Encounter (HOSPITAL_COMMUNITY)
Admission: RE | Admit: 2011-09-04 | Discharge: 2011-09-04 | Disposition: A | Discharge status: Home / Self Care | Payer: Medicare Other | Source: Ambulatory Visit | Attending: Vascular Surgery | Admitting: Vascular Surgery

## 2011-09-04 ENCOUNTER — Encounter (HOSPITAL_COMMUNITY): Payer: Self-pay

## 2011-09-04 HISTORY — DX: Unspecified osteoarthritis, unspecified site: M19.90

## 2011-09-04 LAB — BASIC METABOLIC PANEL
Chloride: 107 mEq/L (ref 96–112)
Creatinine, Ser: 1.36 mg/dL — ABNORMAL HIGH (ref 0.50–1.35)
GFR calc Af Amer: 55 mL/min — ABNORMAL LOW (ref >90)
Potassium: 5.5 mEq/L — ABNORMAL HIGH (ref 3.5–5.1)
Sodium: 136 mEq/L (ref 135–145)

## 2011-09-04 LAB — DIFFERENTIAL
Eosinophils Relative: 7 % — ABNORMAL HIGH (ref 0–5)
Lymphocytes Relative: 29 % (ref 12–46)
Lymphs Abs: 1.7 10*3/uL (ref 0.7–4.0)
Neutro Abs: 3.4 10*3/uL (ref 1.7–7.7)
Neutrophils Relative %: 58 % (ref 43–77)

## 2011-09-04 LAB — URINALYSIS, ROUTINE W REFLEX MICROSCOPIC
Leukocytes, UA: NEGATIVE
Nitrite: NEGATIVE
Protein, ur: 30 mg/dL — AB
Specific Gravity, Urine: 1.021 (ref 1.005–1.030)
Urobilinogen, UA: 0.2 mg/dL (ref 0.0–1.0)

## 2011-09-04 LAB — TYPE AND SCREEN: Antibody Screen: NEGATIVE

## 2011-09-04 LAB — BLOOD GAS, ARTERIAL
Acid-base deficit: 5.5 mmol/L — ABNORMAL HIGH (ref 0.0–2.0)
Bicarbonate: 18.7 mEq/L — ABNORMAL LOW (ref 20.0–24.0)
O2 Saturation: 97.9 %
TCO2: 19.7 mmol/L (ref 0–100)
pCO2 arterial: 32.6 mmHg — ABNORMAL LOW (ref 35.0–45.0)
pO2, Arterial: 101 mmHg — ABNORMAL HIGH (ref 80.0–100.0)

## 2011-09-04 LAB — CBC
MCV: 90.2 fL (ref 78.0–100.0)
Platelets: 146 10*3/uL — ABNORMAL LOW (ref 150–400)
RBC: 3.96 MIL/uL — ABNORMAL LOW (ref 4.22–5.81)
WBC: 5.9 10*3/uL (ref 4.0–10.5)

## 2011-09-04 LAB — SURGICAL PCR SCREEN
MRSA, PCR: NEGATIVE
Staphylococcus aureus: POSITIVE — AB

## 2011-09-04 LAB — URINE MICROSCOPIC-ADD ON

## 2011-09-04 MED ORDER — SODIUM CHLORIDE 0.9 % IV SOLN: INTRAVENOUS | Status: DC

## 2011-09-04 NOTE — Progress Notes (Signed)
REQUESTING STRESS TEST AND CARDIAC CATH FROM [facility].

## 2011-09-05 NOTE — Consult Note (Addendum)
Anesthesia:  Luke [name] is an 77 year old male scheduled for [facility] on 09/11/11.  His history includes CAD/MI with known RCA occlusion with collaterals (via 10/21/03 cath), HTN, hyperlipidemia, DM2, carotid occlusive disease, gout and former smoker.    I was asked to review his preoperative labs and EKG.  His K+ is elevated at 5.5.  I have ordered an ISTAT on arrival the day of surgery to revaluate and notified Luke Done RN at [name].  I also ordered a PT/PTT.  His preoperative EKG is noted.  Overall it appears stable.  His primary Cardiologist is Dr. Earnestine Leys.  He was last seen at [facility] [phone]) on 06/25/11.    This last stress test was [name] in October of 2011.  This showed a partially reversible inferior wall defect correlating with his known occluded RCA.  He was admitted to [facility] for CP in August of this year for CP and ultimately ruled out for an MI.  A stress test was not repeated because it was felt that it would again be abnormal. Dr. Kirke Corin saw him during that hospitalization and mentioned cardiac catheterization would be the next step IF he developed recurrent symptoms.    His last echo was [name] on 05/28/11 showing no wall motion abnormalities and his ejection fraction was 65%. There was some mild aortic valve calcification but otherwise a normal mitral valve. Right heart structures also appear to be within normal limits.  (According to [facility] at [facility], Dr. Earnestine Leys summarized his findings in the progress note, but did not dictate a more comprehensive report.)  There is also an echo from [facility] on the chart that was [name].  I have asked Luke Done RN at [name] to f/u with Dr. Earnestine Leys, if not [name] so already, to confirm if any additional cardiac testing is needed preoperatively.    Addendum:  09/09/11 1200  Dr. Earnestine Leys has cleared patient for this procedure without further cardiac testing.  See Notes tab.

## 2011-09-06 ENCOUNTER — Telehealth: Payer: Self-pay | Admitting: *Deleted

## 2011-09-06 NOTE — Telephone Encounter (Signed)
Luke [name] with [name] (Dr. Tawanna Cooler Early) requesting cardiac clearance for repair of aneurysm.  Has scheduled for Wednesday, 12/12.

## 2011-09-09 NOTE — Telephone Encounter (Signed)
Patient can be cleared for either open or percutaneous aneurysm repair. Patient has known CAD, but has been stable. Moderate increased risk for cardiac complications  but no immediate ischemia with stress testing or catheterization appears indicated before surgery.

## 2011-09-09 NOTE — Telephone Encounter (Signed)
Luke [name] w/ [name] aware of below.

## 2011-09-10 MED ORDER — DEXTROSE 5 % IV SOLN
1.5000 g | INTRAVENOUS | Status: AC
  Administered 2011-09-11: 1.5 g via INTRAVENOUS
  Filled 2011-09-10: qty 1.5

## 2011-09-11 ENCOUNTER — Inpatient Hospital Stay (HOSPITAL_COMMUNITY): Payer: Medicare Other | Admitting: Vascular Surgery

## 2011-09-11 ENCOUNTER — Encounter (HOSPITAL_COMMUNITY)
Admission: RE | Disposition: A | Discharge status: Home / Self Care | Payer: Self-pay | Source: Ambulatory Visit | Attending: Vascular Surgery

## 2011-09-11 ENCOUNTER — Inpatient Hospital Stay (HOSPITAL_COMMUNITY): Payer: Medicare Other

## 2011-09-11 ENCOUNTER — Encounter (HOSPITAL_COMMUNITY): Payer: Self-pay | Admitting: Vascular Surgery

## 2011-09-11 ENCOUNTER — Inpatient Hospital Stay (HOSPITAL_COMMUNITY)
Admission: RE | Admit: 2011-09-11 | Discharge: 2011-09-12 | DRG: 238 | Disposition: A | Discharge status: Home / Self Care | Payer: Medicare Other | Source: Ambulatory Visit | Attending: Vascular Surgery | Admitting: Vascular Surgery

## 2011-09-11 ENCOUNTER — Encounter (HOSPITAL_COMMUNITY): Payer: Self-pay | Admitting: Certified Registered"

## 2011-09-11 DIAGNOSIS — I714 Abdominal aortic aneurysm, without rupture, unspecified: Principal | ICD-10-CM | POA: Diagnosis present

## 2011-09-11 DIAGNOSIS — I6529 Occlusion and stenosis of unspecified carotid artery: Secondary | ICD-10-CM | POA: Diagnosis present

## 2011-09-11 DIAGNOSIS — Z7982 Long term (current) use of aspirin: Secondary | ICD-10-CM

## 2011-09-11 DIAGNOSIS — M129 Arthropathy, unspecified: Secondary | ICD-10-CM | POA: Diagnosis present

## 2011-09-11 DIAGNOSIS — I129 Hypertensive chronic kidney disease with stage 1 through stage 4 chronic kidney disease, or unspecified chronic kidney disease: Secondary | ICD-10-CM | POA: Diagnosis present

## 2011-09-11 DIAGNOSIS — Z01810 Encounter for preprocedural cardiovascular examination: Secondary | ICD-10-CM

## 2011-09-11 DIAGNOSIS — D62 Acute posthemorrhagic anemia: Secondary | ICD-10-CM | POA: Diagnosis not present

## 2011-09-11 DIAGNOSIS — R0989 Other specified symptoms and signs involving the circulatory and respiratory systems: Secondary | ICD-10-CM | POA: Diagnosis present

## 2011-09-11 DIAGNOSIS — Z87891 Personal history of nicotine dependence: Secondary | ICD-10-CM

## 2011-09-11 DIAGNOSIS — R0609 Other forms of dyspnea: Secondary | ICD-10-CM | POA: Diagnosis present

## 2011-09-11 DIAGNOSIS — E119 Type 2 diabetes mellitus without complications: Secondary | ICD-10-CM | POA: Diagnosis present

## 2011-09-11 DIAGNOSIS — M109 Gout, unspecified: Secondary | ICD-10-CM | POA: Diagnosis present

## 2011-09-11 DIAGNOSIS — E785 Hyperlipidemia, unspecified: Secondary | ICD-10-CM | POA: Diagnosis present

## 2011-09-11 DIAGNOSIS — I252 Old myocardial infarction: Secondary | ICD-10-CM

## 2011-09-11 DIAGNOSIS — Z79899 Other long term (current) drug therapy: Secondary | ICD-10-CM

## 2011-09-11 DIAGNOSIS — N189 Chronic kidney disease, unspecified: Secondary | ICD-10-CM | POA: Diagnosis present

## 2011-09-11 DIAGNOSIS — I2510 Atherosclerotic heart disease of native coronary artery without angina pectoris: Secondary | ICD-10-CM | POA: Diagnosis present

## 2011-09-11 DIAGNOSIS — Z01812 Encounter for preprocedural laboratory examination: Secondary | ICD-10-CM

## 2011-09-11 HISTORY — PX: ENDOVASCULAR STENT INSERTION: SHX5161

## 2011-09-11 LAB — CBC
HCT: 29.8 % — ABNORMAL LOW (ref 39.0–52.0)
HCT: 32 % — ABNORMAL LOW (ref 39.0–52.0)
Hemoglobin: 10.5 g/dL — ABNORMAL LOW (ref 13.0–17.0)
MCHC: 35.2 g/dL (ref 30.0–36.0)
MCHC: 34.7 g/dL (ref 30.0–36.0)
MCV: 90.1 fL (ref 78.0–100.0)
Platelets: 126 10*3/uL — ABNORMAL LOW (ref 150–400)
RDW: 13.4 % (ref 11.5–15.5)
RDW: 13.4 % (ref 11.5–15.5)
WBC: 5.8 10*3/uL (ref 4.0–10.5)
WBC: 6.6 10*3/uL (ref 4.0–10.5)

## 2011-09-11 LAB — BASIC METABOLIC PANEL
Chloride: 107 mEq/L (ref 96–112)
GFR calc Af Amer: 58 mL/min — ABNORMAL LOW (ref >90)
GFR calc non Af Amer: 50 mL/min — ABNORMAL LOW (ref >90)
Potassium: 4.7 mEq/L (ref 3.5–5.1)
Sodium: 138 mEq/L (ref 135–145)

## 2011-09-11 LAB — GLUCOSE, CAPILLARY
Glucose-Capillary: 140 mg/dL — ABNORMAL HIGH (ref 70–99)
Glucose-Capillary: 107 mg/dL — ABNORMAL HIGH (ref 70–99)
Glucose-Capillary: 173 mg/dL — ABNORMAL HIGH (ref 70–99)

## 2011-09-11 LAB — PROTIME-INR
INR: 1.02 (ref 0.00–1.49)
INR: 1.21 (ref 0.00–1.49)
Prothrombin Time: 15.6 seconds — ABNORMAL HIGH (ref 11.6–15.2)

## 2011-09-11 LAB — POCT I-STAT 4, (NA,K, GLUC, HGB,HCT)
Glucose, Bld: 166 mg/dL — ABNORMAL HIGH (ref 70–99)
HCT: 37 % — ABNORMAL LOW (ref 39.0–52.0)
Sodium: 140 mEq/L (ref 135–145)

## 2011-09-11 SURGERY — ENDOVASCULAR STENT GRAFT INSERTION
Anesthesia: General | Site: Groin | Laterality: Bilateral | Wound class: Clean

## 2011-09-11 MED ORDER — SIMVASTATIN 5 MG PO TABS
5.0000 mg | ORAL_TABLET | Freq: Every day | ORAL | Status: DC
  Filled 2011-09-11: qty 1

## 2011-09-11 MED ORDER — LABETALOL HCL 5 MG/ML IV SOLN: 10.0000 mg | INTRAVENOUS | Status: DC | PRN

## 2011-09-11 MED ORDER — HEPARIN SODIUM (PORCINE) 5000 UNIT/ML IJ SOLN
5000.0000 [IU] | Freq: Three times a day (TID) | INTRAMUSCULAR | Status: DC
  Administered 2011-09-11 – 2011-09-12 (×2): 5000 [IU] via SUBCUTANEOUS
  Filled 2011-09-11 (×7): qty 1

## 2011-09-11 MED ORDER — HYDROCHLOROTHIAZIDE 12.5 MG PO CAPS
12.5000 mg | ORAL_CAPSULE | Freq: Every day | ORAL | Status: DC
Start: 2011-09-11 — End: 2011-09-12
  Administered 2011-09-11: 12.5 mg via ORAL
  Filled 2011-09-11 (×2): qty 1

## 2011-09-11 MED ORDER — NITROGLYCERIN 0.4 MG SL SUBL: 0.4000 mg | SUBLINGUAL_TABLET | SUBLINGUAL | Status: DC | PRN

## 2011-09-11 MED ORDER — ALLOPURINOL 300 MG PO TABS: 300.0000 mg | ORAL_TABLET | Freq: Every day | ORAL | Status: DC

## 2011-09-11 MED ORDER — SODIUM CHLORIDE 0.9 % IR SOLN
Status: DC | PRN
  Administered 2011-09-11 (×2)

## 2011-09-11 MED ORDER — MIDAZOLAM HCL 5 MG/5ML IJ SOLN
INTRAMUSCULAR | Status: DC | PRN
  Administered 2011-09-11: 2 mg via INTRAVENOUS

## 2011-09-11 MED ORDER — ROCURONIUM BROMIDE 100 MG/10ML IV SOLN
INTRAVENOUS | Status: DC | PRN
  Administered 2011-09-11: 10 mg via INTRAVENOUS
  Administered 2011-09-11: 50 mg via INTRAVENOUS

## 2011-09-11 MED ORDER — FENTANYL CITRATE 0.05 MG/ML IJ SOLN: 25.0000 ug | INTRAMUSCULAR | Status: DC | PRN

## 2011-09-11 MED ORDER — HYDRALAZINE HCL 20 MG/ML IJ SOLN
10.0000 mg | INTRAMUSCULAR | Status: DC | PRN
  Filled 2011-09-11: qty 0.5

## 2011-09-11 MED ORDER — FAMOTIDINE IN NACL 20-0.9 MG/50ML-% IV SOLN
20.0000 mg | Freq: Two times a day (BID) | INTRAVENOUS | Status: DC
  Administered 2011-09-11: 20 mg via INTRAVENOUS
  Filled 2011-09-11 (×3): qty 50

## 2011-09-11 MED ORDER — OXYCODONE-ACETAMINOPHEN 5-325 MG PO TABS
1.0000 | ORAL_TABLET | ORAL | Status: DC | PRN
  Administered 2011-09-11: 2 via ORAL
  Filled 2011-09-11: qty 2

## 2011-09-11 MED ORDER — ALLOPURINOL 300 MG PO TABS
300.0000 mg | ORAL_TABLET | Freq: Every day | ORAL | Status: DC
  Filled 2011-09-11: qty 1

## 2011-09-11 MED ORDER — DEXTROSE 5 % IV SOLN
1.5000 g | Freq: Two times a day (BID) | INTRAVENOUS | Status: DC
  Administered 2011-09-11: 1.5 g via INTRAVENOUS
  Filled 2011-09-11 (×2): qty 1.5

## 2011-09-11 MED ORDER — OLMESARTAN MEDOXOMIL 40 MG PO TABS
40.0000 mg | ORAL_TABLET | Freq: Every day | ORAL | Status: DC
  Filled 2011-09-11: qty 1

## 2011-09-11 MED ORDER — PHENYLEPHRINE HCL 10 MG/ML IJ SOLN
10.0000 mg | INTRAVENOUS | Status: DC | PRN
  Administered 2011-09-11: 10 ug/min via INTRAVENOUS

## 2011-09-11 MED ORDER — ISOSORBIDE MONONITRATE ER 60 MG PO TB24: 60.0000 mg | ORAL_TABLET | Freq: Every day | ORAL | Status: DC

## 2011-09-11 MED ORDER — ASPIRIN 81 MG PO CHEW
81.0000 mg | CHEWABLE_TABLET | Freq: Every day | ORAL | Status: DC
  Administered 2011-09-11: 81 mg via ORAL
  Filled 2011-09-11: qty 1

## 2011-09-11 MED ORDER — ALUM & MAG HYDROXIDE-SIMETH 400-400-40 MG/5ML PO SUSP: 15.0000 mL | ORAL | Status: DC | PRN

## 2011-09-11 MED ORDER — DOCUSATE SODIUM 100 MG PO CAPS: 100.0000 mg | ORAL_CAPSULE | Freq: Every day | ORAL | Status: DC

## 2011-09-11 MED ORDER — GUAIFENESIN-DM 100-10 MG/5ML PO SYRP: 15.0000 mL | ORAL_SOLUTION | ORAL | Status: DC | PRN

## 2011-09-11 MED ORDER — ATENOLOL 50 MG PO TABS
150.0000 mg | ORAL_TABLET | Freq: Every day | ORAL | Status: DC
  Filled 2011-09-11: qty 1

## 2011-09-11 MED ORDER — DOPAMINE-DEXTROSE 3.2-5 MG/ML-% IV SOLN: 3.0000 ug/kg/min | INTRAVENOUS | Status: DC

## 2011-09-11 MED ORDER — ATENOLOL 50 MG PO TABS: 150.0000 mg | ORAL_TABLET | Freq: Every day | ORAL | Status: DC

## 2011-09-11 MED ORDER — LACTATED RINGERS IV SOLN
INTRAVENOUS | Status: DC
  Administered 2011-09-11: 11:00:00 via INTRAVENOUS

## 2011-09-11 MED ORDER — COLCHICINE 0.6 MG PO TABS
0.6000 mg | ORAL_TABLET | Freq: Every day | ORAL | Status: DC
  Administered 2011-09-11: 0.6 mg via ORAL
  Filled 2011-09-11 (×2): qty 1

## 2011-09-11 MED ORDER — METOPROLOL TARTRATE 1 MG/ML IV SOLN: 2.0000 mg | INTRAVENOUS | Status: DC | PRN

## 2011-09-11 MED ORDER — ISOSORBIDE MONONITRATE ER 60 MG PO TB24
60.0000 mg | ORAL_TABLET | Freq: Every day | ORAL | Status: DC
  Filled 2011-09-11: qty 1

## 2011-09-11 MED ORDER — SODIUM CHLORIDE 0.9 % IV SOLN
10.0000 g | INTRAVENOUS | Status: DC
  Filled 2011-09-11: qty 40

## 2011-09-11 MED ORDER — INSULIN ASPART 100 UNIT/ML ~~LOC~~ SOLN
0.0000 [IU] | Freq: Three times a day (TID) | SUBCUTANEOUS | Status: DC
  Filled 2011-09-11: qty 3

## 2011-09-11 MED ORDER — IODIXANOL 320 MG/ML IV SOLN
INTRAVENOUS | Status: DC | PRN
  Administered 2011-09-11: 80 mL via INTRA_ARTERIAL

## 2011-09-11 MED ORDER — ACETAMINOPHEN 650 MG RE SUPP: 325.0000 mg | RECTAL | Status: DC | PRN

## 2011-09-11 MED ORDER — LOSARTAN POTASSIUM 50 MG PO TABS
50.0000 mg | ORAL_TABLET | Freq: Every day | ORAL | Status: DC
  Filled 2011-09-11: qty 1

## 2011-09-11 MED ORDER — NEOSTIGMINE METHYLSULFATE 1 MG/ML IJ SOLN
INTRAMUSCULAR | Status: DC | PRN
  Administered 2011-09-11: 5 mg via INTRAVENOUS

## 2011-09-11 MED ORDER — POTASSIUM CHLORIDE CRYS ER 20 MEQ PO TBCR: 20.0000 meq | EXTENDED_RELEASE_TABLET | Freq: Once | ORAL | Status: AC | PRN

## 2011-09-11 MED ORDER — FENTANYL CITRATE 0.05 MG/ML IJ SOLN
INTRAMUSCULAR | Status: DC | PRN
  Administered 2011-09-11: 50 ug via INTRAVENOUS
  Administered 2011-09-11: 250 ug via INTRAVENOUS

## 2011-09-11 MED ORDER — ONDANSETRON HCL 4 MG/2ML IJ SOLN: 4.0000 mg | Freq: Four times a day (QID) | INTRAMUSCULAR | Status: DC | PRN

## 2011-09-11 MED ORDER — LACTATED RINGERS IV SOLN: INTRAVENOUS | Status: DC

## 2011-09-11 MED ORDER — PROMETHAZINE HCL 25 MG/ML IJ SOLN: 6.2500 mg | INTRAMUSCULAR | Status: DC | PRN

## 2011-09-11 MED ORDER — COLCHICINE 0.6 MG PO TABS: 0.6000 mg | ORAL_TABLET | Freq: Every day | ORAL | Status: DC

## 2011-09-11 MED ORDER — OMEGA-3 FATTY ACIDS 1000 MG PO CAPS
1.0000 g | ORAL_CAPSULE | Freq: Every day | ORAL | Status: DC
Start: 2011-09-12 — End: 2011-09-12
  Filled 2011-09-11: qty 1

## 2011-09-11 MED ORDER — METFORMIN HCL 500 MG PO TABS: 1000.0000 mg | ORAL_TABLET | Freq: Two times a day (BID) | ORAL | Status: DC

## 2011-09-11 MED ORDER — ACETAMINOPHEN 325 MG PO TABS: 325.0000 mg | ORAL_TABLET | ORAL | Status: DC | PRN

## 2011-09-11 MED ORDER — PROTAMINE SULFATE 10 MG/ML IV SOLN
INTRAVENOUS | Status: DC | PRN
  Administered 2011-09-11: 40 mg via INTRAVENOUS
  Administered 2011-09-11: 10 mg via INTRAVENOUS

## 2011-09-11 MED ORDER — GLYBURIDE 5 MG PO TABS
5.0000 mg | ORAL_TABLET | Freq: Two times a day (BID) | ORAL | Status: DC
  Administered 2011-09-12: 5 mg via ORAL
  Filled 2011-09-11 (×3): qty 1

## 2011-09-11 MED ORDER — PHENOL 1.4 % MT LIQD: 1.0000 | OROMUCOSAL | Status: DC | PRN

## 2011-09-11 MED ORDER — ONDANSETRON HCL 4 MG/2ML IJ SOLN
INTRAMUSCULAR | Status: DC | PRN
  Administered 2011-09-11: 4 mg via INTRAVENOUS

## 2011-09-11 MED ORDER — MAGNESIUM SULFATE 40 MG/ML IJ SOLN
2.0000 g | Freq: Once | INTRAMUSCULAR | Status: AC | PRN
  Filled 2011-09-11: qty 50

## 2011-09-11 MED ORDER — SODIUM CHLORIDE 0.9 % IJ SOLN
INTRAMUSCULAR | Status: AC
  Filled 2011-09-11: qty 10

## 2011-09-11 MED ORDER — HEPARIN SODIUM (PORCINE) 1000 UNIT/ML IJ SOLN
INTRAMUSCULAR | Status: DC | PRN
  Administered 2011-09-11: 7000 [IU] via INTRAVENOUS

## 2011-09-11 MED ORDER — LACTATED RINGERS IV SOLN
INTRAVENOUS | Status: DC | PRN
  Administered 2011-09-11: 13:00:00 via INTRAVENOUS

## 2011-09-11 MED ORDER — PROPOFOL 10 MG/ML IV EMUL
INTRAVENOUS | Status: DC | PRN
  Administered 2011-09-11: 150 mg via INTRAVENOUS

## 2011-09-11 MED ORDER — GLYCOPYRROLATE 0.2 MG/ML IJ SOLN
INTRAMUSCULAR | Status: DC | PRN
  Administered 2011-09-11: .4 mg via INTRAVENOUS
  Administered 2011-09-11: 0.2 mg via INTRAVENOUS

## 2011-09-11 MED ORDER — SODIUM CHLORIDE 0.9 % IV SOLN: 500.0000 mL | Freq: Once | INTRAVENOUS | Status: AC | PRN

## 2011-09-11 MED ORDER — MORPHINE SULFATE 2 MG/ML IJ SOLN: 2.0000 mg | INTRAMUSCULAR | Status: DC | PRN

## 2011-09-11 MED ORDER — SODIUM CHLORIDE 0.9 % IR SOLN
Status: DC | PRN
  Administered 2011-09-11: 1000 mL

## 2011-09-11 SURGICAL SUPPLY — 76 items
BAG DECANTER FOR FLEXI CONT (MISCELLANEOUS) IMPLANT
BAG SNAP BAND KOVER 36X36 (MISCELLANEOUS) ×2 IMPLANT
BALLN CODA OCL 2-9.0-35-120-3 (BALLOONS)
BALLOON COD OCL 2-9.0-35-120-3 (BALLOONS) IMPLANT
BENZOIN TINCTURE PRP APPL 2/3 (GAUZE/BANDAGES/DRESSINGS) ×2 IMPLANT
CANISTER SUCTION 2500CC (MISCELLANEOUS) ×2 IMPLANT
CATH BEACON 5.038 65CM KMP-01 (CATHETERS) ×2 IMPLANT
CATH OMNI FLUSH .035X70CM (CATHETERS) ×2 IMPLANT
CLOSURE STERI STRIP 1/2 X4 (GAUZE/BANDAGES/DRESSINGS) ×2 IMPLANT
CLOTH BEACON ORANGE TIMEOUT ST (SAFETY) ×2 IMPLANT
COVER MAYO STAND STRL (DRAPES) ×2 IMPLANT
COVER SURGICAL LIGHT HANDLE (MISCELLANEOUS) ×4 IMPLANT
DEVICE CLOSURE PERCLS PRGLD 6F (VASCULAR PRODUCTS) ×4 IMPLANT
DEVICE TORQUE 50000 (MISCELLANEOUS) ×2 IMPLANT
DRAIN CHANNEL 10F 3/8 F FF (DRAIN) IMPLANT
DRAIN CHANNEL 10M FLAT 3/4 FLT (DRAIN) IMPLANT
DRAPE C-ARM 42X72 X-RAY (DRAPES) ×2 IMPLANT
DRAPE TABLE COVER HEAVY DUTY (DRAPES) ×2 IMPLANT
DRSG TEGADERM 2.38X2.75 (GAUZE/BANDAGES/DRESSINGS) ×2 IMPLANT
ELECT CAUTERY BLADE 6.4 (BLADE) ×2 IMPLANT
ELECT REM PT RETURN 9FT ADLT (ELECTROSURGICAL) ×4
ELECTRODE REM PT RTRN 9FT ADLT (ELECTROSURGICAL) ×2 IMPLANT
EVACUATOR 3/16  PVC DRAIN (DRAIN)
EVACUATOR 3/16 PVC DRAIN (DRAIN) IMPLANT
EVACUATOR SILICONE 100CC (DRAIN) IMPLANT
EXCLUDER TRUNK (Endovascular Graft) ×2 IMPLANT
GLOVE BIOGEL M 7.0 STRL (GLOVE) ×2 IMPLANT
GLOVE BIOGEL PI IND STRL 6.5 (GLOVE) ×2 IMPLANT
GLOVE BIOGEL PI IND STRL 7.0 (GLOVE) ×1 IMPLANT
GLOVE BIOGEL PI IND STRL 7.5 (GLOVE) ×1 IMPLANT
GLOVE BIOGEL PI INDICATOR 6.5 (GLOVE) ×2
GLOVE BIOGEL PI INDICATOR 7.0 (GLOVE) ×1
GLOVE BIOGEL PI INDICATOR 7.5 (GLOVE) ×1
GLOVE ECLIPSE 6.5 STRL STRAW (GLOVE) ×2 IMPLANT
GLOVE ECLIPSE 7.0 STRL STRAW (GLOVE) ×2 IMPLANT
GLOVE EXAM NITRILE XS STR PU (GLOVE) ×2 IMPLANT
GLOVE SS BIOGEL STRL SZ 7.5 (GLOVE) ×1 IMPLANT
GLOVE SUPERSENSE BIOGEL SZ 7.5 (GLOVE) ×1
GOWN STRL NON-REIN LRG LVL3 (GOWN DISPOSABLE) ×8 IMPLANT
GRAFT BALLN CATH 65CM (STENTS) ×1 IMPLANT
GRAFT EXCLUDER LEG (Endovascular Graft) ×2 IMPLANT
GUIDEWIRE ANGLED .035X150CM (WIRE) ×2 IMPLANT
KIT BASIN OR (CUSTOM PROCEDURE TRAY) ×2 IMPLANT
KIT ROOM TURNOVER OR (KITS) ×2 IMPLANT
NEEDLE PERC 18GX7CM (NEEDLE) ×2 IMPLANT
NS IRRIG 1000ML POUR BTL (IV SOLUTION) ×4 IMPLANT
PACK AORTA (CUSTOM PROCEDURE TRAY) ×2 IMPLANT
PAD ARMBOARD 7.5X6 YLW CONV (MISCELLANEOUS) ×4 IMPLANT
PENCIL BUTTON HOLSTER BLD 10FT (ELECTRODE) ×2 IMPLANT
PERCLOSE PROGLIDE 6F (VASCULAR PRODUCTS) ×8
SHEATH 16FRX35 (SHEATH) ×2 IMPLANT
SHEATH AVANTI 11CM 8FR (MISCELLANEOUS) ×2 IMPLANT
SHEATH BRITE TIP 8FR 23CM (MISCELLANEOUS) ×2 IMPLANT
SHEATH DRYSEAL GORE 20X28 (SHEATH) ×2 IMPLANT
STAPLER VISISTAT 35W (STAPLE) IMPLANT
STENT GRAFT BALLN CATH 65CM (STENTS) ×1
STOPCOCK MORSE 400PSI 3WAY (MISCELLANEOUS) ×2 IMPLANT
STRIP CLOSURE SKIN 1/2X4 (GAUZE/BANDAGES/DRESSINGS) ×2 IMPLANT
SUT ETHILON 3 0 PS 1 (SUTURE) IMPLANT
SUT PROLENE 5 0 C 1 24 (SUTURE) IMPLANT
SUT VIC AB 2-0 CTX 36 (SUTURE) IMPLANT
SUT VIC AB 3-0 SH 27 (SUTURE)
SUT VIC AB 3-0 SH 27X BRD (SUTURE) IMPLANT
SUT VICRYL 4-0 PS2 18IN ABS (SUTURE) ×4 IMPLANT
SYR 20CC LL (SYRINGE) ×4 IMPLANT
SYR 30ML LL (SYRINGE) IMPLANT
SYR 5ML LL (SYRINGE) ×2 IMPLANT
SYR MEDRAD MARK V 150ML (SYRINGE) ×2 IMPLANT
SYRINGE 10CC LL (SYRINGE) ×6 IMPLANT
TOWEL OR 17X24 6PK STRL BLUE (TOWEL DISPOSABLE) ×4 IMPLANT
TOWEL OR 17X26 10 PK STRL BLUE (TOWEL DISPOSABLE) ×4 IMPLANT
TRAY FOLEY CATH 14FRSI W/METER (CATHETERS) IMPLANT
TUBING HIGH PRESSURE 120CM (CONNECTOR) ×2 IMPLANT
WATER STERILE IRR 1000ML POUR (IV SOLUTION) ×2 IMPLANT
WIRE AMPLATZ SS-J .035X180CM (WIRE) ×8 IMPLANT
WIRE BENTSON .035X145CM (WIRE) ×4 IMPLANT

## 2011-09-11 NOTE — H&P (Signed)
  Pt re-examined and H&P reviewed from 07/30/11 and 08/06/11 with no changes.  Pt ready for stent graft repair of AAA.  Kristen Loader Azyria Osmon,MD

## 2011-09-11 NOTE — Anesthesia Procedure Notes (Signed)
Procedure Name: Intubation Date/Time: 09/11/2011 12:00 PM Performed by: Ellin Goodie Pre-anesthesia Checklist: Patient identified, Emergency Drugs available, Suction available, Patient being monitored and Timeout performed Patient Re-evaluated:Patient Re-evaluated prior to inductionOxygen Delivery Method: Circle System Utilized Preoxygenation: Pre-oxygenation with 100% oxygen Intubation Type: IV induction Ventilation: Mask ventilation without difficulty Laryngoscope Size: Mac and 3 Grade View: Grade I Tube type: Oral Tube size: 7.5 mm Number of attempts: 1 Airway Equipment and Method: stylet Placement Confirmation: ETT inserted through vocal cords under direct vision Secured at: 23 cm Tube secured with: Tape Dental Injury: Teeth and Oropharynx as per pre-operative assessment

## 2011-09-11 NOTE — Plan of Care (Signed)
Problem: Phase I Progression Outcomes Goal: Voiding after catheter removal Outcome: Completed/Met Date Met:  09/11/11 No catheter.  Voiding.

## 2011-09-11 NOTE — OR Nursing (Signed)
flouro time is 3 minutes & 36 seconds

## 2011-09-11 NOTE — Transfer of Care (Signed)
Immediate Anesthesia Transfer of Care Note  Patient: Luke [name]  Procedure(s) Performed:  ENDOVASCULAR STENT GRAFT INSERTION  Patient Location: PACU  Anesthesia Type: General  Level of Consciousness: awake  Airway & Oxygen Therapy: Patient Spontanous Breathing  Post-op Assessment: Report given to PACU RN  Post vital signs: stable  Complications: No apparent anesthesia complications

## 2011-09-11 NOTE — Anesthesia Postprocedure Evaluation (Signed)
  Anesthesia Post-op Note  Patient: Luke [name]  Procedure(s) Performed:  ENDOVASCULAR STENT GRAFT INSERTION  Patient Location: PACU  Anesthesia Type: General  Level of Consciousness: awake, alert  and oriented  Airway and Oxygen Therapy: Patient Spontanous Breathing and Patient connected to nasal cannula oxygen  Post-op Pain: none  Post-op Assessment: Post-op Vital signs reviewed, Patient's Cardiovascular Status Stable, Respiratory Function Stable, Patent Airway, No signs of Nausea or vomiting and Pain level controlled  Post-op Vital Signs: Reviewed and stable  Complications: No apparent anesthesia complications

## 2011-09-11 NOTE — OR Nursing (Signed)
No foley catheter per Dr. Jean Rosenthal

## 2011-09-11 NOTE — Op Note (Signed)
OPERATIVE REPORT  DATE OF SURGERY: 09/11/2011  PATIENT: Luke [name], 77 y.o. male MRN: [id number]  DOB: 1929-01-05  PRE-OPERATIVE DIAGNOSIS: Abdominal aortic aneurysm  POST-OPERATIVE DIAGNOSIS:  Same  PROCEDURE: Gore Excluder stent graft repair  SURGEON:  Gretta Began, M.D.  Co-surgeon: Leonides Sake, M.D.  ANESTHESIA:  Gen.  EBL: 100 ml  Total I/O In: 1000 [I.V.:1000] Out: -   BLOOD ADMINISTERED: None  DRAINS: None  SPECIMEN: None  COUNTS CORRECT:  YES  PLAN OF CARE: PACU stable   PATIENT DISPOSITION:  PACU - hemodynamically stable  PROCEDURE DETAILS: The patient was taken to the operating room and placed in the supine position where the abdomen and both groins were prepped and draped in the usual sterile fashion. Using ultrasound visualization bilateral percutaneous access was obtained to the common femoral arteries. A double Perclose technique was used and both groins for eventual closure.. H. French sheath were passed over guidewires and both groins up to the level of the suprarenal aorta on the left a 18 French sheath was passed over an Amplatz superstiff wire. On the right a 50 French sheath was placed. The main device was placed on the left and the limbs were not crossed. A 16 x 14 x 14 cm main body was chosen. This was placed at the level of the renal arteries and a pigtail injection was taken via the right groin to show the location of the renal arteries. The main portion of the graft was deployed in that repeat injection confirmed this to below the level of renal arteries. Next the contralateral gate was cannulated using a Kumpe catheter. The pigtail catheter was repositioned to this and twisted in the main body to confirm there was indeed in the date. Contralateral leg was chosen appropriate length after retrograde injection through the right femoral sheath this was a 16 x 11.5 cm contralateral limb. This was positioned at the appropriate overlap and was deployed.  Finally the proximal distal and junction were all gently dilated with balloon. The catheter was repositioned above the level of the graft and a final completion arteriogram showed no evidence of endoleak with good positioning. The sheaths were removed and the double Perclose devices were secured for hemostasis. The patient had been given 7000 units heparin prior to placement of the sheath and this was reversed with protamine 50 mg. The puncture areas in the groin and closed with 4-0 subcuticular Vicryl stitches the patient was taken to the recovery in stable condition.   Gretta Began, M.D. 09/11/2011 2:02 PM

## 2011-09-11 NOTE — Anesthesia Preprocedure Evaluation (Signed)
Anesthesia Evaluation  Patient identified by MRN, date of birth, ID band Patient awake    Reviewed: Allergy & Precautions, H&P , NPO status , Patient's Chart, lab work & pertinent test results  Airway Mallampati: I TM Distance: >3 FB Neck ROM: Full    Dental  (+) Edentulous Upper and Dental Advisory Given   Pulmonary former smoker (quit 30 yrs ago) clear to auscultation  Pulmonary exam normal       Cardiovascular hypertension (took BP meds today), Pt. on medications and Pt. on home beta blockers + angina (last chest pain 3 months ago) with exertion + CAD (2011 cardiolite- no ischemia, normal LVF, EF 61%, ECHO 8/12, normal LVF, normal valves, 2005 cath: occluded RCA with collaterals, otherwise non-obstructive), + Past MI (MI 1995) and + Peripheral Vascular Disease (AAA 4.7x4.5 cm) Regular Normal    Neuro/Psych S/p CEA    GI/Hepatic negative GI ROS, Neg liver ROS,   Endo/Other  Diabetes mellitus- (glu 166 at 08:23), Type 2, Oral Hypoglycemic Agents  Renal/GU Renal InsufficiencyRenal disease (creat 1.32)     Musculoskeletal   Abdominal   Peds  Hematology negative hematology ROS (+)   Anesthesia Other Findings   Reproductive/Obstetrics                           Anesthesia Physical Anesthesia Plan  ASA: III  Anesthesia Plan: General   Post-op Pain Management:    Induction: Intravenous  Airway Management Planned: Oral ETT  Additional Equipment: Arterial line, CVP and Ultrasound Guidance Line Placement  Intra-op Plan:   Post-operative Plan: Extubation in OR  Informed Consent: I have reviewed the patients History and Physical, chart, labs and discussed the procedure including the risks, benefits and alternatives for the proposed anesthesia with the patient or authorized representative who has indicated his/her understanding and acceptance.   Dental advisory given  Plan Discussed with: CRNA  and Surgeon  Anesthesia Plan Comments: (Plan routine monitors, A line, PA catheter introducer, GETA)        Anesthesia Quick Evaluation

## 2011-09-11 NOTE — Op Note (Signed)
OPERATIVE NOTE   PROCEDURE: 1. Right common femoral cannulation under ultrasound guidance 2. Placement of catheter in aorta 3. Repair of right common femoral artery by "preclose" technique 4. Placement of right iliac limb (16 mm x 115 mm) 5. As part of Endovascular aortic repair (see Dr. Bosie Helper note)  PRE-OPERATIVE DIAGNOSIS: abdominal aortic aneurysm   POST-OPERATIVE DIAGNOSIS: same as above   CO-SURGEONS: Gretta Began, MD; Leonides Sake, MD  ANESTHESIA: general  ESTIMATED BLOOD LOSS: see Dr. Bosie Helper note  FINDING(S):  No endoleak at the end of case, successful exclusion of aneurysm  SPECIMEN(S):  none  INDICATIONS:   DEMARI GALES is a 77 y.o. male who presents with atherosclerotic abdominal aortic aneurysm.  The patient presents today for endovascular aortic repair.  The patient is aware the risks of aortic surgery include but are not limited to: bleeding, need for transfusion, infection, death, stroke, paralysis, wound complications, bowel injuries, impotence, bowel ischemia, extended ventilation, and possible future secondary interventions.  The procedure cares a mortality rate of 1-2% and morbidity rate of 15%.  The patient agreed to proceed with the case.  DESCRIPTION: The bulk of this procedure will be dictated by Dr. Arbie Cookey.  After full informed written consent was obtained, the patient was brought back to the operating room.  He had received IV antibiotics prior to induction.  After adequate anesthesia was obtained, he was prepped and draped in the standard fashion for an open or endovascular aortic repair.  I turned by attention to the right groin.  Under ultrasound guidance, the right common femoral artery will be cannulated with a 18 gauge needle.  The [facility] wire was passed up into the aorta. The tract was dilated with a 8-Fr dilator.  Over the wire, a Proglide device was deployed with a 30 degree medial angle.  The wire was reloaded through the device and a new Proglide device  was deployed at a 30 degree later angle.  The device was then exchanged for a long 8-Fr sheath.  In such fashion, the "preclose" technique was performed.  Dr. Arbie Cookey repeated these steps on the left side and loaded a short 8-Fr sheath on the left.  A pigtail catheter was loaded into the suprarenal position and connected to a power injector.  A power injector aortogram was performed, demonstrating the renal arteries' position.  The wire was exchanged for an Amplatz wire and the sheath was exchanged for a 18-Fr sheath on the left.  The main body device loaded on the left wire and advanced to the infrarenal position (see Dr. Bosie Helper note).  It was partially deployed below the level of the lowest renal artery, the left one.  A repeat aortogram demonstrated the position of the endograft and renal arteries.  At this point, I used a Kumpfe catheter with a Benson wire and navigated through the atherosclerotic aorta, eventually getting into the suprarenal position.  The wire was exchanged for an Amplatz wire.  The right sheath was then exchanged for a 16-Fr Dryseal sheath.  I then placed a Benson wire through the Dryseal sheath and loaded a Kumpfe catheter over the wire.  The wire was exchanged for a Glidewire.  Using the glidewire and Kumpfe catheter, I was able to cannulated the contralateral gate.  The catheter and wire were advanced into the suprarenal aorta.  The Amplatz wire was removed from the right sheath and the Glidewire was exchanged for this wire.  I loaded a marker pigtail catheter over the right wire and reconstituted  the catheter within the endograft.  I spun the graft to verify successful cannulation of the graft.  The wire was returned to the suprarenal position.  The catheter was pulled down so a marker corresponded to the flow divider.  I did a hand injection via the right sheath to verify the position of the right internal iliac artery.  Based on the measurements, a 16 mm x 115 mm limb was necessary.  The  catheter was removed.  A 16 mm x 115 mm Gore Excluder limb was then loaded over the wire and deployed with adequate overlap within the short leg of the main endograft.  The stent graft delivery device was removed.  The entirety of this endograft was then molded with the Q50 balloon.  A pigtail catheter was reloaded over the right wire.  Completion aortogram demonstrated no endoleak and successful exclusion of the aneurysm.  A this point, the Amplatz wire was reloaded through the pigtail catheter and the catheter was removed.  Pressure was held to the right common femoral artery, while I removed the right sheath.  There was no drop in systemic blood pressure.  The two sets of right Proglide sutures were tightened up sequentially twice before I felt adequate hemostasis had been obtained.  The right wire was then removed and the Proglide sutures were tightened down once again.  All four sutures were held in place under tension with a hemostat.  After waiting 2-3 minutes, the sutures were cut with the Proglide suture scissor.  In a similar fashion, the left common femoral artery was repaired by Dr. Arbie Cookey with the "Preclose" technique.  The right groin was repaired with a couple of U-stitches of 4-0 Vicryl.  The skin was cleaned, dried, and reinforced with Dermabond.  The left groin was closed in a similar fashion.  COMPLICATIONS: none  CONDITION: stable  Leonides Sake, MD [facility] Office: [phone] Pager: [phone]  09/11/2011, 1:57 PM

## 2011-09-12 ENCOUNTER — Encounter (HOSPITAL_COMMUNITY): Payer: Self-pay | Admitting: Vascular Surgery

## 2011-09-12 LAB — BASIC METABOLIC PANEL
BUN: 19 mg/dL (ref 6–23)
CO2: 21 mEq/L (ref 19–32)
Calcium: 9.1 mg/dL (ref 8.4–10.5)
Chloride: 103 mEq/L (ref 96–112)
Creatinine, Ser: 1.17 mg/dL (ref 0.50–1.35)

## 2011-09-12 LAB — CBC
HCT: 31.1 % — ABNORMAL LOW (ref 39.0–52.0)
MCH: 30.9 pg (ref 26.0–34.0)
MCV: 89.9 fL (ref 78.0–100.0)
Platelets: 120 10*3/uL — ABNORMAL LOW (ref 150–400)
RDW: 13.6 % (ref 11.5–15.5)
WBC: 5.3 10*3/uL (ref 4.0–10.5)

## 2011-09-12 LAB — GLUCOSE, CAPILLARY

## 2011-09-12 MED ORDER — OXYCODONE HCL 5 MG PO TABS: 5.0000 mg | ORAL_TABLET | ORAL | Status: AC | PRN

## 2011-09-12 NOTE — Progress Notes (Signed)
Nursing Note Pt dc'd home. Lines dc'd. Dc instructions provided, pt verbalizes understanding. Vss.

## 2011-09-12 NOTE — Progress Notes (Addendum)
[facility] Progress Note  09/12/2011 7:50 AM POD 1   No complaints.  Afebrile  96%RA Filed Vitals:   09/12/11 0720  BP: 155/69  Pulse: 59  Temp:   Resp: 13    Incisions:  Right groin with minimal bloody drainage.  Left groin c/d/i.  No hematoma Extremities:  Palpable DP pulses bilaterally. Lungs:  CTAB Abdomen:  Soft NT/ND Cardiac:  RRR  CBC    Component Value Date/Time   WBC 5.3 09/12/2011 0400   RBC 3.46* 09/12/2011 0400   HGB 10.7* 09/12/2011 0400   HCT 31.1* 09/12/2011 0400   PLT 120* 09/12/2011 0400   MCV 89.9 09/12/2011 0400   MCH 30.9 09/12/2011 0400   MCHC 34.4 09/12/2011 0400   RDW 13.6 09/12/2011 0400   LYMPHSABS 1.7 09/04/2011 1123   MONOABS 0.3 09/04/2011 1123   EOSABS 0.4 09/04/2011 1123   BASOSABS 0.1 09/04/2011 1123    BMET    Component Value Date/Time   NA 134* 09/12/2011 0400   K 4.7 09/12/2011 0400   CL 103 09/12/2011 0400   CO2 21 09/12/2011 0400   GLUCOSE 110* 09/12/2011 0400   BUN 19 09/12/2011 0400   CREATININE 1.17 09/12/2011 0400   CREATININE 1.51* 07/30/2011 0958   CALCIUM 9.1 09/12/2011 0400   GFRNONAA 57* 09/12/2011 0400   GFRAA 66* 09/12/2011 0400    INR    Component Value Date/Time   INR 1.21 09/11/2011 1500     Intake/Output Summary (Last 24 hours) at 09/12/11 0750 Last data filed at 09/12/11 0600  Gross per 24 hour  Intake   1595 ml  Output   1600 ml  Net     -5 ml     Assessment/Plan:  77 y.o. male is s/p endovascular stent graft POD 1 -doing well this am. -Cr stable with good UOP -acute surgical blood loss anemia--tolerating. -Home today. -f/u with Dr. Arbie Cookey in 4 weeks   Newton Pigg, PA-C [facility] [phone] 09/12/2011 7:50 AM

## 2011-09-12 NOTE — Discharge Summary (Signed)
[facility] Discharge Summary  Luke [name] July 30, 1929 77 y.o. male  [id number]  Admission Date: 09/11/2011  Discharge Date: 09/12/11  Physician: Larina Earthly, MD  Admission Diagnosis: AAA   HPI:   This is a 77 y.o. male presented for continued followup of his infrarenal abdominal aortic aneurysm. Since been followed with serial ultrasounds over several years. He continues to have no symptoms referable to his aneurysm. As a history of prior myocardial infarction diabetes hypertension he is a previous cigarette smoker. He had a prior uneventful carotid endarterectomy has had no neurologic deficits     Hospital Course:  The patient was admitted to the hospital and taken to the operating room on 09/11/2011 and underwent endovascular repair of AAA.  The pt tolerated the procedure well and was transported to the PACU in good condition. By POD 1, he is doing well. The remainder of the hospital course consisted of increasing ambulation and increasing intake of solids without difficulty.    Basename 09/12/11 0400 09/11/11 1500  NA 134* 138  K 4.7 4.7  CL 103 107  CO2 21 21  GLUCOSE 110* 127*  BUN 19 22  CALCIUM 9.1 8.6    Basename 09/12/11 0400 09/11/11 1836  WBC 5.3 6.6  HGB 10.7* 11.1*  HCT 31.1* 32.0*  PLT 120* 126*    Basename 09/11/11 1500 09/11/11 0816  INR 1.21 1.02     Discharge Instructions:   The patient is discharged to home with extensive instructions on wound care and progressive ambulation.  They are instructed not to drive or perform any heavy lifting until returning to see the physician in his office.  Discharge Orders    Future Appointments: Provider: Department: Dept Phone: Center:   07/21/2012 9:00 AM [name] [phone] [name]   07/21/2012 9:30 AM [name] [place] [phone] [name]     Future Orders Please Complete By Expires   Resume previous diet      Driving Restrictions      Comments:   No driving for 4 weeks   Lifting restrictions      Comments:   No lifting for 4 weeks   Call MD for:  temperature >100.5      Call MD for:  redness, tenderness, or signs of infection (pain, swelling, bleeding, redness, odor or green/yellow discharge around incision site)      Call MD for:  severe or increased pain, loss or decreased feeling  in affected limb(s)      ABDOMINAL PROCEDURE/ANEURYSM REPAIR/AORTO-BIFEMORAL BYPASS:  Call MD for increased abdominal pain; cramping diarrhea; nausea/vomiting      may wash over wound with mild soap and water      Scheduling Instructions:   Shower daily with soap and water starting Friday, September 13, 2011   Remove dressing in 48 hours        Discharge Diagnosis:  AAA  Secondary Diagnosis: Patient Active Problem List  Diagnoses  . HYPERLIPIDEMIA-MIXED  . HYPERTENSION, UNSPECIFIED  . CAD, NATIVE VESSEL  . ABDOMINAL AORTIC ANEURYSM  . BRUIT  . DYSPNEA  . Carotid artery disease  . Chronic kidney disease   Past Medical History  Diagnosis Date  . Abdominal aortic aneurysm     4.4 x 4.4 cm August 2011  . Hypertension   . Hyperlipidemia   . Bruit   . CAD (coronary artery disease)   . Type 2 diabetes mellitus   . Diabetes mellitus Age 64  . Myocardial infarction 1995  .  Gout   . Carotid artery occlusion   . Dyspnea     for years   . Arthritis     right hip pain  . Angina     none in few months       Luke [name], Luke [name]  Home Medication Instructions [id number]   Printed on:09/12/11 8119  Medication Information                    losartan (COZAAR) 50 MG tablet TAKE 1 TABLET BY MOUTH ONCE A DAY           allopurinol (ZYLOPRIM) 300 MG tablet Take 1 tablet by mouth Daily.           metFORMIN (GLUCOPHAGE) 1000 MG tablet Take 1 tablet by mouth Twice daily.           glyBURIDE (DIABETA) 5 MG tablet Take 1 tablet by mouth Twice daily.           atenolol (TENORMIN) 50 MG tablet Take 3 tablets by mouth Daily.             lovastatin (MEVACOR) 20 MG tablet Take 1 tablet by mouth Daily.           hydrochlorothiazide (,MICROZIDE/HYDRODIURIL,) 12.5 MG capsule Take 1 capsule by mouth Daily.           COLCRYS 0.6 MG tablet Take 1 tablet by mouth Every 1 hour. Until gout relieves           aspirin 81 MG chewable tablet Chew 81 mg by mouth daily.             fish oil-omega-3 fatty acids 1000 MG capsule Take 1 g by mouth daily.             olmesartan (BENICAR) 40 MG tablet Take 40 mg by mouth daily.             isosorbide mononitrate (IMDUR) 60 MG 24 hr tablet Take 1 tablet (60 mg total) by mouth daily.           NITROSTAT 0.4 MG SL tablet Place 0.4 mg under the tongue every 5 (five) minutes x 3 doses as needed. For chest pain           oxyCODONE (ROXICODONE) 5 MG immediate release tablet Take 1 tablet (5 mg total) by mouth every 4 (four) hours as needed for pain.             Disposition: home  Patient's condition: is Good   Newton Pigg, PA-C [facility] [phone] 09/12/2011  7:59 AM

## 2011-09-16 ENCOUNTER — Other Ambulatory Visit: Payer: Self-pay | Admitting: Physician Assistant

## 2011-09-16 DIAGNOSIS — I714 Abdominal aortic aneurysm, without rupture: Secondary | ICD-10-CM

## 2011-11-12 ENCOUNTER — Other Ambulatory Visit: Payer: Self-pay | Admitting: *Deleted

## 2011-11-12 DIAGNOSIS — Z9889 Other specified postprocedural states: Secondary | ICD-10-CM

## 2011-11-12 DIAGNOSIS — Z8679 Personal history of other diseases of the circulatory system: Secondary | ICD-10-CM

## 2011-11-12 DIAGNOSIS — I714 Abdominal aortic aneurysm, without rupture: Secondary | ICD-10-CM

## 2011-11-25 ENCOUNTER — Encounter: Payer: Self-pay | Admitting: Vascular Surgery

## 2011-11-26 ENCOUNTER — Ambulatory Visit (INDEPENDENT_AMBULATORY_CARE_PROVIDER_SITE_OTHER): Payer: Medicare Other | Admitting: Vascular Surgery

## 2011-11-26 ENCOUNTER — Ambulatory Visit: Payer: Medicare Other | Admitting: Vascular Surgery

## 2011-11-26 ENCOUNTER — Ambulatory Visit
Admission: RE | Admit: 2011-11-26 | Discharge: 2011-11-26 | Disposition: A | Discharge status: Home / Self Care | Payer: Medicare Other | Source: Ambulatory Visit | Attending: Vascular Surgery | Admitting: Vascular Surgery

## 2011-11-26 ENCOUNTER — Encounter: Payer: Self-pay | Admitting: Vascular Surgery

## 2011-11-26 ENCOUNTER — Other Ambulatory Visit: Payer: Self-pay | Admitting: Vascular Surgery

## 2011-11-26 VITALS — BP 160/78 | HR 74 | Resp 18 | Ht 70.0 in | Wt 183.1 lb

## 2011-11-26 DIAGNOSIS — I714 Abdominal aortic aneurysm, without rupture: Secondary | ICD-10-CM

## 2011-11-26 DIAGNOSIS — Z8679 Personal history of other diseases of the circulatory system: Secondary | ICD-10-CM

## 2011-11-26 DIAGNOSIS — I70219 Atherosclerosis of native arteries of extremities with intermittent claudication, unspecified extremity: Secondary | ICD-10-CM | POA: Insufficient documentation

## 2011-11-26 LAB — BUN: BUN: 28 mg/dL — ABNORMAL HIGH (ref 6–23)

## 2011-11-26 LAB — CREATININE, SERUM: Creat: 1.6 mg/dL — ABNORMAL HIGH (ref 0.50–1.35)

## 2011-11-26 MED ORDER — IOHEXOL 350 MG/ML SOLN
50.0000 mL | Freq: Once | INTRAVENOUS | Status: AC | PRN
  Administered 2011-11-26: 50 mL via INTRAVENOUS

## 2011-11-26 NOTE — Progress Notes (Signed)
The patient presents for followup of his stent graft repair of abdominal aortic aneurysm in December 2012. This was done with percutaneous access in both groins and he was discharged home on postoperative day #1. He has continued to do well since his surgery with no complications. He does report that he is overall diminished stamina over the past several years and no change since surgery. He does report tingling in his hands and feet and this is been present for quite some time. He does not have any focal neurologic deficits.  Physical exam groins without evidence of false aneurysm or complication he has 2+ femoral and 2+ dorsalis pedis pulses bilaterally. Abdominal exam reveals no evidence of pulsatile mass.  CT scan shows excellent positioning of the stent graft with no evidence of endoleak his maximal sac size is down from maximal 5.9-5.7 cm.  Impression and plan Luke [name] outcome following stent graft repair of abdominal aortic aneurysm is successful. We'll see him again in 6 months with repeat CT scan followup. He will notify should he develop any difficulty in the antrum

## 2012-01-23 ENCOUNTER — Other Ambulatory Visit: Payer: Self-pay | Admitting: *Deleted

## 2012-01-23 MED ORDER — ISOSORBIDE MONONITRATE ER 60 MG PO TB24: 60.0000 mg | ORAL_TABLET | Freq: Every day | ORAL | Status: DC

## 2012-02-22 ENCOUNTER — Other Ambulatory Visit: Payer: Self-pay | Admitting: Cardiology

## 2012-04-17 ENCOUNTER — Other Ambulatory Visit: Payer: Self-pay | Admitting: *Deleted

## 2012-04-17 DIAGNOSIS — I714 Abdominal aortic aneurysm, without rupture: Secondary | ICD-10-CM

## 2012-04-17 DIAGNOSIS — Z48812 Encounter for surgical aftercare following surgery on the circulatory system: Secondary | ICD-10-CM

## 2012-06-22 ENCOUNTER — Other Ambulatory Visit: Payer: Self-pay | Admitting: Vascular Surgery

## 2012-06-22 LAB — BUN: BUN: 22 mg/dL (ref 6–23)

## 2012-06-29 ENCOUNTER — Encounter: Payer: Self-pay | Admitting: Vascular Surgery

## 2012-06-30 ENCOUNTER — Encounter: Payer: Self-pay | Admitting: Vascular Surgery

## 2012-06-30 ENCOUNTER — Ambulatory Visit
Admission: RE | Admit: 2012-06-30 | Discharge: 2012-06-30 | Disposition: A | Discharge status: Home / Self Care | Payer: Medicare Other | Source: Ambulatory Visit | Attending: Vascular Surgery | Admitting: Vascular Surgery

## 2012-06-30 ENCOUNTER — Ambulatory Visit (INDEPENDENT_AMBULATORY_CARE_PROVIDER_SITE_OTHER): Payer: Medicare Other | Admitting: Vascular Surgery

## 2012-06-30 VITALS — BP 177/92 | HR 111 | Resp 20 | Ht 70.0 in | Wt 180.0 lb

## 2012-06-30 DIAGNOSIS — Z48812 Encounter for surgical aftercare following surgery on the circulatory system: Secondary | ICD-10-CM

## 2012-06-30 DIAGNOSIS — I714 Abdominal aortic aneurysm, without rupture: Secondary | ICD-10-CM

## 2012-06-30 DIAGNOSIS — I6529 Occlusion and stenosis of unspecified carotid artery: Secondary | ICD-10-CM

## 2012-06-30 DIAGNOSIS — I739 Peripheral vascular disease, unspecified: Secondary | ICD-10-CM

## 2012-06-30 MED ORDER — IOHEXOL 350 MG/ML SOLN
75.0000 mL | Freq: Once | INTRAVENOUS | Status: AC | PRN
  Administered 2012-06-30: 75 mL via INTRAVENOUS

## 2012-06-30 NOTE — Progress Notes (Signed)
The patient is a for followup of stent graft repair of large abdominal aortic aneurysm December 2012. He is quite active with no new major medical difficulties. He lives independently with his wife. He has no new major medical difficulties  Past Medical History  Diagnosis Date  . Abdominal aortic aneurysm     4.4 x 4.4 cm August 2011  . Hypertension   . Hyperlipidemia   . Bruit   . CAD (coronary artery disease)   . Type 2 diabetes mellitus   . Diabetes mellitus Age 78  . Myocardial infarction 1995  . Gout   . Carotid artery occlusion   . Dyspnea     for years   . Arthritis     right hip pain  . Angina     none in few months    History  Substance Use Topics  . Smoking status: Former Smoker -- 2.0 packs/day for 30 years    Types: Cigarettes    Quit date: 10/27/1976  . Smokeless tobacco: Never Used  . Alcohol Use: No    Family History  Problem Relation Age of Onset  . Heart disease Mother     No Known Allergies  Current outpatient prescriptions:allopurinol (ZYLOPRIM) 300 MG tablet, Take 1 tablet by mouth Daily., Disp: , Rfl: ;  aspirin 81 MG chewable tablet, Chew 81 mg by mouth daily.  , Disp: , Rfl: ;  atenolol (TENORMIN) 50 MG tablet, Take 3 tablets by mouth Daily., Disp: , Rfl: ;  COLCRYS 0.6 MG tablet, Take 1 tablet by mouth Every 1 hour. Until gout relieves, Disp: , Rfl:  fish oil-omega-3 fatty acids 1000 MG capsule, Take 1 g by mouth daily.  , Disp: , Rfl: ;  glyBURIDE (DIABETA) 5 MG tablet, Take 1 tablet by mouth Twice daily., Disp: , Rfl: ;  hydrochlorothiazide (,MICROZIDE/HYDRODIURIL,) 12.5 MG capsule, Take 1 capsule by mouth Daily., Disp: , Rfl: ;  isosorbide mononitrate (IMDUR) 60 MG 24 hr tablet, TAKE 1 TABLET (60 MG TOTAL) BY MOUTH DAILY., Disp: 30 tablet, Rfl: 6 losartan (COZAAR) 50 MG tablet, TAKE 1 TABLET BY MOUTH ONCE A DAY, Disp: 90 tablet, Rfl: 3;  lovastatin (MEVACOR) 20 MG tablet, Take 1 tablet by mouth Daily., Disp: , Rfl: ;  metFORMIN (GLUCOPHAGE) 1000 MG  tablet, Take 1 tablet by mouth Twice daily., Disp: , Rfl: ;  NITROSTAT 0.4 MG SL tablet, Place 0.4 mg under the tongue every 5 (five) minutes x 3 doses as needed. For chest pain, Disp: , Rfl:  olmesartan (BENICAR) 40 MG tablet, Take 40 mg by mouth daily.  , Disp: , Rfl:  No current facility-administered medications for this visit. Facility-Administered Medications Ordered in Other Visits: iohexol (OMNIPAQUE) 350 MG/ML injection 75 mL, 75 mL, Intravenous, Once PRN, Medication Radiologist, MD, 75 mL at 06/30/12 1228  BP 177/92  Pulse 111  Resp 20  Ht 5\' 10"  (1.778 m)  Wt 180 lb (81.647 kg)  BMI 25.83 kg/m2  Body mass index is 25.83 kg/(m^2).       Physical exam: Well-developed well-nourished white male in no acute distress Abdomen soft nontender no palpable aneurysm noted, moderate obesity Heart regular rate and rhythm Pulse status 2+ radial 2+ femoral 2+ popliteal 2+ posterior tibial pulses. He does have prominent popliteal pulses with no true aneurysm felt Skin without ulcers or rashes Neurologically he is grossly intact  CT scan today was reviewed. The official report radiology report is not back. He does have good positioning of the stent graft  with no evidence of endoleak. To my measurement his aneurysm size is shrunk down to 5.0 cm.  Reviewed this with the patient and his wife present. Recommend dropping back to yearly CT scan for evaluation. He will continue his usual activities without limitations

## 2012-07-21 ENCOUNTER — Other Ambulatory Visit: Payer: Medicare Other

## 2012-08-01 ENCOUNTER — Other Ambulatory Visit: Payer: Self-pay | Admitting: Cardiology

## 2012-08-18 ENCOUNTER — Ambulatory Visit (INDEPENDENT_AMBULATORY_CARE_PROVIDER_SITE_OTHER): Payer: Medicare Other | Admitting: Cardiology

## 2012-08-18 ENCOUNTER — Encounter: Payer: Self-pay | Admitting: Cardiology

## 2012-08-18 VITALS — BP 138/75 | HR 74 | Ht 70.0 in | Wt 182.1 lb

## 2012-08-18 DIAGNOSIS — I2510 Atherosclerotic heart disease of native coronary artery without angina pectoris: Secondary | ICD-10-CM

## 2012-08-18 DIAGNOSIS — I10 Essential (primary) hypertension: Secondary | ICD-10-CM

## 2012-08-18 DIAGNOSIS — E785 Hyperlipidemia, unspecified: Secondary | ICD-10-CM

## 2012-08-18 NOTE — Assessment & Plan Note (Signed)
Symptomatically stable on medical therapy, history of occluded RCA with left to right collaterals and normal LV function. No changes made to current regimen. ECG is normal today. Encourage regular activity, discussed warning signs. Otherwise keep regular annual visit.

## 2012-08-18 NOTE — Patient Instructions (Addendum)

## 2012-08-18 NOTE — Assessment & Plan Note (Signed)
On statin therapy, followed by Dr. Neita Carp. LDL should be close to 70 if possible.

## 2012-08-18 NOTE — Progress Notes (Signed)
Clinical Summary Luke [name] is a 78 y.o.male presenting for followup. He is a former patient of Dr. Andee Lineman, prefers to stay with [facility]. He was last seen in September 2012. Interval followup noted with Dr. Arbie Cookey for PAD.  He is here with his wife today. States that he has been doing very well, no exertional chest pain or limiting shortness of breath. He is active outdoors. He has not needed any nitroglycerin.  Lipids are followed by Dr. Neita Carp, patient reports compliance with his medications and diet.  ECG today is normal.   No Known Allergies  Current Outpatient Prescriptions  Medication Sig Dispense Refill  . allopurinol (ZYLOPRIM) 300 MG tablet Take 1 tablet by mouth Daily.      Marland Kitchen aspirin 81 MG chewable tablet Chew 81 mg by mouth daily.        Marland Kitchen atenolol (TENORMIN) 50 MG tablet Take 3 tablets by mouth Daily.      Marland Kitchen COLCRYS 0.6 MG tablet Take 1 tablet by mouth Every 1 hour. Until gout relieves      . fish oil-omega-3 fatty acids 1000 MG capsule Take 1 g by mouth daily.        Marland Kitchen glyBURIDE (DIABETA) 5 MG tablet Take 1 tablet by mouth Twice daily.      . hydrochlorothiazide (,MICROZIDE/HYDRODIURIL,) 12.5 MG capsule Take 1 capsule by mouth Daily.      . isosorbide mononitrate (IMDUR) 60 MG 24 hr tablet TAKE 1 TABLET (60 MG TOTAL) BY MOUTH DAILY.  30 tablet  5  . lovastatin (MEVACOR) 20 MG tablet Take 1 tablet by mouth at bedtime.       . metFORMIN (GLUCOPHAGE) 1000 MG tablet Take 1 tablet by mouth Twice daily.      Marland Kitchen NITROSTAT 0.4 MG SL tablet Place 0.4 mg under the tongue every 5 (five) minutes x 3 doses as needed. For chest pain        Past Medical History  Diagnosis Date  . Abdominal aortic aneurysm     Status post stent graft repair December 2012 - Dr. Arbie Cookey  . Essential hypertension, benign   . Hyperlipidemia   . Coronary atherosclerosis of native coronary artery     Occluded RCA with left-to-right collaterals  . Type 2 diabetes mellitus   . Myocardial infarction 1995  .  Gout   . Carotid artery occlusion   . Arthritis     Social History Luke [name] reports that he quit smoking about 35 years ago. His smoking use included Cigarettes. He has a 60 pack-year smoking history. He has never used smokeless tobacco. Luke [name] reports that he does not drink alcohol.  Review of Systems No palpitations, bleeding episodes, falls or syncope. Stable appetite although not particularly robust. States that he has lost some weight over time. Otherwise negative.  Physical Examination Filed Vitals:   08/18/12 1324  BP: 138/75  Pulse: 74   Filed Weights   08/18/12 1324  Weight: 182 lb 1.9 oz (82.609 kg)   No acute distress. HEENT: Conjunctiva and lids normal, oropharynx clear. Neck: Supple, no elevated JVP, no significant carotid bruits, no thyromegaly. Lungs: Clear to auscultation, nonlabored breathing at rest. Cardiac: Regular rate and rhythm, no S3 or significant systolic murmur, no pericardial rub. Abdomen: Soft, nontender, bowel sounds present. Extremities: No pitting edema, distal pulses 2+. Skin: Warm and dry. Musculoskeletal: No kyphosis. Neuropsychiatric: Alert and oriented x3, affect grossly appropriate.   Problem List and Plan   CAD, NATIVE VESSEL Symptomatically stable on  medical therapy, history of occluded RCA with left to right collaterals and normal LV function. No changes made to current regimen. ECG is normal today. Encourage regular activity, discussed warning signs. Otherwise keep regular annual visit.  HYPERLIPIDEMIA-MIXED On statin therapy, followed by Dr. Neita Carp. LDL should be close to 70 if possible.  Essential hypertension, benign Continue current regimen.  ABDOMINAL AORTIC ANEURYSM Status post stent graft repair in December 2012, followed by Dr. Arbie Cookey.    Jonelle Sidle, M.D., F.A.C.C.

## 2012-08-18 NOTE — Assessment & Plan Note (Signed)
Status post stent graft repair in December 2012, followed by Dr. Arbie Cookey.

## 2012-08-18 NOTE — Assessment & Plan Note (Signed)
Continue current regimen

## 2013-02-08 ENCOUNTER — Other Ambulatory Visit: Payer: Self-pay | Admitting: Physician Assistant

## 2013-03-31 DIAGNOSIS — R4182 Altered mental status, unspecified: Secondary | ICD-10-CM

## 2013-05-13 ENCOUNTER — Encounter: Payer: Self-pay | Admitting: Cardiology

## 2013-07-05 ENCOUNTER — Encounter: Payer: Self-pay | Admitting: Cardiology

## 2013-07-06 ENCOUNTER — Ambulatory Visit: Payer: Medicare Other | Admitting: Vascular Surgery

## 2013-07-06 ENCOUNTER — Ambulatory Visit
Admission: RE | Admit: 2013-07-06 | Discharge: 2013-07-06 | Disposition: A | Discharge status: Home / Self Care | Payer: Medicare Other | Source: Ambulatory Visit | Attending: Vascular Surgery | Admitting: Vascular Surgery

## 2013-07-06 ENCOUNTER — Other Ambulatory Visit: Payer: Medicare Other

## 2013-07-06 DIAGNOSIS — I739 Peripheral vascular disease, unspecified: Secondary | ICD-10-CM

## 2013-07-06 DIAGNOSIS — I714 Abdominal aortic aneurysm, without rupture: Secondary | ICD-10-CM

## 2013-07-06 DIAGNOSIS — Z48812 Encounter for surgical aftercare following surgery on the circulatory system: Secondary | ICD-10-CM

## 2013-07-06 MED ORDER — IOHEXOL 350 MG/ML SOLN
75.0000 mL | Freq: Once | INTRAVENOUS | Status: AC | PRN
  Administered 2013-07-06: 75 mL via INTRAVENOUS

## 2013-07-13 ENCOUNTER — Ambulatory Visit: Payer: Medicare Other | Admitting: Vascular Surgery

## 2013-07-13 ENCOUNTER — Other Ambulatory Visit: Payer: Medicare Other

## 2013-07-19 ENCOUNTER — Encounter: Payer: Self-pay | Admitting: Vascular Surgery

## 2013-07-20 ENCOUNTER — Ambulatory Visit (HOSPITAL_COMMUNITY)
Admission: RE | Admit: 2013-07-20 | Discharge: 2013-07-20 | Disposition: A | Discharge status: Home / Self Care | Payer: Medicare Other | Source: Ambulatory Visit | Attending: Vascular Surgery | Admitting: Vascular Surgery

## 2013-07-20 ENCOUNTER — Encounter: Payer: Self-pay | Admitting: Vascular Surgery

## 2013-07-20 ENCOUNTER — Ambulatory Visit (INDEPENDENT_AMBULATORY_CARE_PROVIDER_SITE_OTHER): Payer: Medicare Other | Admitting: Vascular Surgery

## 2013-07-20 VITALS — BP 136/73 | HR 63 | Ht 70.0 in | Wt 182.6 lb

## 2013-07-20 DIAGNOSIS — I714 Abdominal aortic aneurysm, without rupture, unspecified: Secondary | ICD-10-CM

## 2013-07-20 DIAGNOSIS — Z48812 Encounter for surgical aftercare following surgery on the circulatory system: Secondary | ICD-10-CM

## 2013-07-20 DIAGNOSIS — I6529 Occlusion and stenosis of unspecified carotid artery: Secondary | ICD-10-CM | POA: Insufficient documentation

## 2013-07-20 DIAGNOSIS — Z09 Encounter for follow-up examination after completed treatment for conditions other than malignant neoplasm: Secondary | ICD-10-CM | POA: Insufficient documentation

## 2013-07-20 DIAGNOSIS — I6523 Occlusion and stenosis of bilateral carotid arteries: Secondary | ICD-10-CM

## 2013-07-20 NOTE — Progress Notes (Signed)
Here today for followup of right carotid endarterectomy 2010 and stent graft repair were done aortic aneurysm 2012. He looks quite good. She today with his wife. He is able to do whatever he feels like doing with some generalized tiredness but to limitation. He denies any focal neurologic deficits.  Past Medical History  Diagnosis Date  . Abdominal aortic aneurysm     Status post stent graft repair December 2012 - Dr. Arbie Cookey  . Essential hypertension, benign   . Hyperlipidemia   . Coronary atherosclerosis of native coronary artery     Occluded RCA with left-to-right collaterals  . Type 2 diabetes mellitus   . Myocardial infarction 1995  . Gout   . Carotid artery occlusion   . Arthritis     History  Substance Use Topics  . Smoking status: Former Smoker -- 2.00 packs/day for 30 years    Types: Cigarettes    Quit date: 10/27/1976  . Smokeless tobacco: Never Used  . Alcohol Use: No    Family History  Problem Relation Age of Onset  . Heart disease Mother   . Diabetes Mother   . Hypertension Father     No Known Allergies  Current outpatient prescriptions:allopurinol (ZYLOPRIM) 300 MG tablet, Take 1 tablet by mouth Daily., Disp: , Rfl: ;  aspirin 81 MG chewable tablet, Chew 81 mg by mouth daily.  , Disp: , Rfl: ;  atenolol (TENORMIN) 50 MG tablet, Take 3 tablets by mouth Daily., Disp: , Rfl: ;  COLCRYS 0.6 MG tablet, Take 1 tablet by mouth Every 1 hour. Until gout relieves, Disp: , Rfl:  fish oil-omega-3 fatty acids 1000 MG capsule, Take 1 g by mouth daily.  , Disp: , Rfl: ;  glyBURIDE (DIABETA) 5 MG tablet, Take 1 tablet by mouth Twice daily., Disp: , Rfl: ;  HUMULIN R 100 UNIT/ML injection, Inject into the skin. Sliding scale, Disp: , Rfl: ;  hydrochlorothiazide (,MICROZIDE/HYDRODIURIL,) 12.5 MG capsule, Take 1 capsule by mouth Daily., Disp: , Rfl:  isosorbide mononitrate (IMDUR) 60 MG 24 hr tablet, TAKE 1 TABLET BY MOUTH EVERY DAY, Disp: 30 tablet, Rfl: 6;  LANTUS 100 UNIT/ML  injection, Inject 22 Units into the skin daily. , Disp: , Rfl: ;  lovastatin (MEVACOR) 20 MG tablet, Take 1 tablet by mouth at bedtime. , Disp: , Rfl: ;  NITROSTAT 0.4 MG SL tablet, Place 0.4 mg under the tongue every 5 (five) minutes x 3 doses as needed. For chest pain, Disp: , Rfl:  metFORMIN (GLUCOPHAGE) 1000 MG tablet, Take 1 tablet by mouth Twice daily., Disp: , Rfl:   BP 136/73  Pulse 63  Ht 5\' 10"  (1.778 m)  Wt 182 lb 9.6 oz (82.827 kg)  BMI 26.2 kg/m2  SpO2 100%  Body mass index is 26.2 kg/(m^2).       Physical exam well-developed well-nourished gentleman appearing stated age of 62 Well here it healed carotid incision with no bruits bilaterally Grossly intact neurologically Radial and femoral pulses 2+ bilaterally Abdomen soft with no aneurysm palpable  Noninvasive carotid duplex evaluation today in our office reveals a widely patent endarterectomy on the right and no significant stenosis on the left  CT scan from earlier last week reveals excellent positioning of the stent graft and no evidence of endoleak  Impression and plan stable followup with stent graft repair of his abdominal aortic aneurysm and endarterectomy. We will see him again in one year with carotid duplex in 2 years with CT angiogram of the stent graft.  He did have an admission to the hospital in July of this year in the wife has asked me to review his CT scan of his head at that time. This did suggest intracranial small vessel disease and potentially some tortuosity versus small aneurysm of his intracranial vessels. I explained I would certainly would not recommend any further evaluation of this since this in all likelihood is related to tortuosity. He does not 1 any further evaluation.

## 2013-09-09 ENCOUNTER — Other Ambulatory Visit: Payer: Self-pay | Admitting: Cardiology

## 2013-09-09 MED ORDER — ISOSORBIDE MONONITRATE ER 60 MG PO TB24: ORAL_TABLET | ORAL | Status: DC

## 2013-09-17 ENCOUNTER — Encounter: Payer: Self-pay | Admitting: Cardiology

## 2013-09-17 ENCOUNTER — Ambulatory Visit (INDEPENDENT_AMBULATORY_CARE_PROVIDER_SITE_OTHER): Payer: Medicare Other | Admitting: Cardiology

## 2013-09-17 VITALS — BP 156/74 | HR 61 | Ht 70.0 in | Wt 188.0 lb

## 2013-09-17 DIAGNOSIS — I2510 Atherosclerotic heart disease of native coronary artery without angina pectoris: Secondary | ICD-10-CM

## 2013-09-17 DIAGNOSIS — I10 Essential (primary) hypertension: Secondary | ICD-10-CM

## 2013-09-17 DIAGNOSIS — E785 Hyperlipidemia, unspecified: Secondary | ICD-10-CM

## 2013-09-17 DIAGNOSIS — I714 Abdominal aortic aneurysm, without rupture: Secondary | ICD-10-CM

## 2013-09-17 MED ORDER — NITROGLYCERIN 0.4 MG SL SUBL: 0.4000 mg | SUBLINGUAL_TABLET | SUBLINGUAL | Status: DC | PRN

## 2013-09-17 NOTE — Patient Instructions (Signed)
Continue all current medications. Your physician wants you to follow up in:  1 year.  You will receive a reminder letter in the mail one-two months in advance.  If you don't receive a letter, please call our office to schedule the follow up appointment   

## 2013-09-17 NOTE — Progress Notes (Signed)
Clinical Summary Luke [name] is an 79 y.o.male last seen in November 2013. Interval followup noted with Dr. Arbie Cookey this October. He is aortic stent graft was in good position without endoleak.  He is here with his wife today. Doing well, no angina symptoms or increasing shortness of breath. States active around the house. He reports no problems with his medications.  Lab work from October showed BUN 33, creatinine 1.6, potassium 4.6, hemoglobin 12. Carotid Dopplers from October of this year demonstrated patent right CEA site, less than 40% stenosis of the LICA. Dr. Neita Carp is following his lipids. No changes in medications.  He is now on insulin for management of his diabetes.  No Known Allergies  Current Outpatient Prescriptions  Medication Sig Dispense Refill  . allopurinol (ZYLOPRIM) 300 MG tablet Take 1 tablet by mouth Daily.      Marland Kitchen aspirin 81 MG chewable tablet Chew 81 mg by mouth daily.        Marland Kitchen atenolol (TENORMIN) 50 MG tablet Take 3 tablets by mouth Daily.      . CELEBREX 200 MG capsule Take 1 capsule by mouth as needed.      . COLCRYS 0.6 MG tablet Take 1 tablet by mouth Every 1 hour. Until gout relieves      . fish oil-omega-3 fatty acids 1000 MG capsule Take 1 g by mouth daily.        Marland Kitchen HUMULIN R 100 UNIT/ML injection Inject into the skin. Sliding scale      . hydrochlorothiazide (,MICROZIDE/HYDRODIURIL,) 12.5 MG capsule Take 1 capsule by mouth Daily.      . isosorbide mononitrate (IMDUR) 60 MG 24 hr tablet TAKE 1 TABLET BY MOUTH EVERY DAY  30 tablet  6  . LANTUS 100 UNIT/ML injection Inject 30 Units into the skin daily.       Marland Kitchen losartan (COZAAR) 25 MG tablet Take 25 mg by mouth daily.      . nitroGLYCERIN (NITROSTAT) 0.4 MG SL tablet Place 1 tablet (0.4 mg total) under the tongue every 5 (five) minutes x 3 doses as needed. For chest pain  25 tablet  3  . pravastatin (PRAVACHOL) 40 MG tablet Take 40 mg by mouth daily.       No current facility-administered medications for this  visit.    Past Medical History  Diagnosis Date  . Abdominal aortic aneurysm     Status post stent graft repair December 2012 - Dr. Arbie Cookey  . Essential hypertension, benign   . Hyperlipidemia   . Coronary atherosclerosis of native coronary artery     Occluded RCA with left-to-right collaterals  . Type 2 diabetes mellitus   . Myocardial infarction 1995  . Gout   . Carotid artery occlusion   . Arthritis     Past Surgical History  Procedure Laterality Date  . Cataract extraction, bilateral    . Cardiac catheterization      > 5 YRS SEES DR DEGENT ([facility])  . Endovascular stent insertion  09/11/2011    Procedure: ENDOVASCULAR STENT GRAFT INSERTION;  Surgeon: Larina Earthly, MD;  Location[facility];  Service: Vascular;  Laterality: Bilateral;  . Carotid endarterectomy  04/2009    Dr. Langston Masker no stenosis of the left internal carotid artery and patent right internal carotid artery    Social History Luke [name] reports that he quit smoking about 36 years ago. His smoking use included Cigarettes. He has a 60 pack-year smoking history. He has never used smokeless tobacco. [name] reports that he does not drink alcohol.  Review of Systems No palpitations or syncope. No orthopnea or PND. No claudication. Otherwise negative.  Physical Examination Filed Vitals:   09/17/13 0844  BP: 156/74  Pulse: 61   Filed Weights   09/17/13 0844  Weight: 188 lb (85.276 kg)    Appears comfortable at rest. HEENT: Conjunctiva and lids normal, oropharynx clear.  Neck: Supple, no elevated JVP, no significant carotid bruits, no thyromegaly.  Lungs: Clear to auscultation, nonlabored breathing at rest.  Cardiac: Regular rate and rhythm, no S3 or significant systolic murmur, no pericardial rub.  Abdomen: Soft, nontender, bowel sounds present.  Extremities: No pitting edema, distal pulses 2+.  Skin: Warm and dry.  Musculoskeletal: No kyphosis.  Neuropsychiatric: Alert and oriented x3, affect grossly  appropriate.   Problem List and Plan   CAD, NATIVE VESSEL Symptomatically stable with history of occluded RCA with left to right collaterals. Continue medical therapy and observation. Refill provided for nitroglycerin.  Essential hypertension, benign Blood pressure elevated today. He reports compliance with his medications. We discussed sodium restriction. Keep follow up with Dr. Neita Carp in case additional medication adjustments are needed.  HYPERLIPIDEMIA-MIXED Followed by Dr. Neita Carp, on Pravachol.  AAA (abdominal aortic aneurysm) Stable abdominal aortic aneurysm stent graft without endoleak, followed by Dr. Arbie Cookey in October.    Jonelle Sidle, M.D., F.A.C.C.

## 2013-09-17 NOTE — Assessment & Plan Note (Addendum)
Symptomatically stable with history of occluded RCA with left to right collaterals. Continue medical therapy and observation. Refill provided for nitroglycerin.

## 2013-09-17 NOTE — Assessment & Plan Note (Signed)
Stable abdominal aortic aneurysm stent graft without endoleak, followed by Dr. Arbie Cookey in October.

## 2013-09-17 NOTE — Assessment & Plan Note (Signed)
Blood pressure elevated today. He reports compliance with his medications. We discussed sodium restriction. Keep follow up with Dr. Neita Carp in case additional medication adjustments are needed.

## 2013-09-17 NOTE — Assessment & Plan Note (Signed)
Followed by Dr. Neita Carp, on Pravachol.

## 2014-05-09 ENCOUNTER — Other Ambulatory Visit: Payer: Self-pay | Admitting: *Deleted

## 2014-05-09 MED ORDER — ISOSORBIDE MONONITRATE ER 60 MG PO TB24: ORAL_TABLET | ORAL | Status: DC

## 2014-07-25 ENCOUNTER — Encounter: Payer: Self-pay | Admitting: Family

## 2014-07-26 ENCOUNTER — Encounter: Payer: Self-pay | Admitting: Family

## 2014-07-26 ENCOUNTER — Ambulatory Visit (HOSPITAL_COMMUNITY)
Admission: RE | Admit: 2014-07-26 | Discharge: 2014-07-26 | Disposition: A | Discharge status: Home / Self Care | Payer: Medicare Other | Source: Ambulatory Visit | Attending: Vascular Surgery | Admitting: Vascular Surgery

## 2014-07-26 ENCOUNTER — Ambulatory Visit (INDEPENDENT_AMBULATORY_CARE_PROVIDER_SITE_OTHER): Payer: Medicare Other | Admitting: Family

## 2014-07-26 VITALS — BP 137/85 | HR 65 | Resp 16 | Ht 70.0 in | Wt 194.0 lb

## 2014-07-26 DIAGNOSIS — I6523 Occlusion and stenosis of bilateral carotid arteries: Secondary | ICD-10-CM

## 2014-07-26 DIAGNOSIS — I714 Abdominal aortic aneurysm, without rupture, unspecified: Secondary | ICD-10-CM

## 2014-07-26 DIAGNOSIS — I6529 Occlusion and stenosis of unspecified carotid artery: Secondary | ICD-10-CM | POA: Insufficient documentation

## 2014-07-26 DIAGNOSIS — Z48812 Encounter for surgical aftercare following surgery on the circulatory system: Secondary | ICD-10-CM | POA: Insufficient documentation

## 2014-07-26 NOTE — Patient Instructions (Signed)
Stroke Prevention Some medical conditions and behaviors are associated with an increased chance of having a stroke. You may prevent a stroke by making healthy choices and managing medical conditions. HOW CAN I REDUCE MY RISK OF HAVING A STROKE?   Stay physically active. Get at least 30 minutes of activity on most or all days.  Do not smoke. It may also be helpful to avoid exposure to secondhand smoke.  Limit alcohol use. Moderate alcohol use is considered to be:  No more than 2 drinks per day for men.  No more than 1 drink per day for nonpregnant women.  Eat healthy foods. This involves:  Eating 5 or more servings of fruits and vegetables a day.  Making dietary changes that address high blood pressure (hypertension), high cholesterol, diabetes, or obesity.  Manage your cholesterol levels.  Making food choices that are high in fiber and low in saturated fat, trans fat, and cholesterol may control cholesterol levels.  Take any prescribed medicines to control cholesterol as directed by your health care provider.  Manage your diabetes.  Controlling your carbohydrate and sugar intake is recommended to manage diabetes.  Take any prescribed medicines to control diabetes as directed by your health care provider.  Control your hypertension.  Making food choices that are low in salt (sodium), saturated fat, trans fat, and cholesterol is recommended to manage hypertension.  Take any prescribed medicines to control hypertension as directed by your health care provider.  Maintain a healthy weight.  Reducing calorie intake and making food choices that are low in sodium, saturated fat, trans fat, and cholesterol are recommended to manage weight.  Stop drug abuse.  Avoid taking birth control pills.  Talk to your health care provider about the risks of taking birth control pills if you are over 35 years old, smoke, get migraines, or have ever had a blood clot.  Get evaluated for sleep  disorders (sleep apnea).  Talk to your health care provider about getting a sleep evaluation if you snore a lot or have excessive sleepiness.  Take medicines only as directed by your health care provider.  For some people, aspirin or blood thinners (anticoagulants) are helpful in reducing the risk of forming abnormal blood clots that can lead to stroke. If you have the irregular heart rhythm of atrial fibrillation, you should be on a blood thinner unless there is a good reason you cannot take them.  Understand all your medicine instructions.  Make sure that other conditions (such as anemia or atherosclerosis) are addressed. SEEK IMMEDIATE MEDICAL CARE IF:   You have sudden weakness or numbness of the face, arm, or leg, especially on one side of the body.  Your face or eyelid droops to one side.  You have sudden confusion.  You have trouble speaking (aphasia) or understanding.  You have sudden trouble seeing in one or both eyes.  You have sudden trouble walking.  You have dizziness.  You have a loss of balance or coordination.  You have a sudden, severe headache with no known cause.  You have new chest pain or an irregular heartbeat. Any of these symptoms may represent a serious problem that is an emergency. Do not wait to see if the symptoms will go away. Get medical help at once. Call your local emergency services (911 in [place] Do not drive yourself to the hospital. Document Released: 10/24/2004 Document Revised: 01/31/2014 Document Reviewed: 03/19/2013 [employer] Patient Information 2015 [employer]. This information is not intended to replace advice given   to you by your health care provider. Make sure you discuss any questions you have with your health care provider.  

## 2014-07-26 NOTE — Progress Notes (Signed)
Established Carotid Patient   History of Present Illness  Luke [name] is a 80 y.o. male patient of Dr. Arbie CookeyEarly who returns today for followup of right carotid endarterectomy performed in 2010 and stent graft repair for aortic aneurysm performed in 2012. He is able to do whatever he feels like doing with some generalized tiredness but to limitation. He denies any focal neurologic deficits.  The patient denies any history of TIA or stroke symptoms, specifically the patient denies a history of amaurosis fugax or monocular blindness, denies a history unilateral  of facial drooping, denies a history of hemiplegia, and denies a history of receptive or expressive aphasia.    The patient denies claudication symptoms with walking, denies non healing wounds. The patient states that he has been short of breath since his MI in 1995, he denies being any more short of breath lately, denies cough.  The patient denies New Medical or Surgical History.  Pt Diabetic: Yes, states in good contol Pt smoker: former smoker, quit in the 1980's   Pt meds include: Statin : Yes ASA: Yes Other anticoagulants/antiplatelets: no   Past Medical History  Diagnosis Date  . Abdominal aortic aneurysm     Status post stent graft repair December 2012 - Dr. Arbie CookeyEarly  . Essential hypertension, benign   . Hyperlipidemia   . Coronary atherosclerosis of native coronary artery     Occluded RCA with left-to-right collaterals  . Type 2 diabetes mellitus   . Myocardial infarction 1995  . Gout   . Carotid artery occlusion   . Arthritis     Social History History  Substance Use Topics  . Smoking status: Former Smoker -- 2.00 packs/day for 30 years    Types: Cigarettes    Quit date: 10/27/1976  . Smokeless tobacco: Never Used  . Alcohol Use: No    Family History Family History  Problem Relation Age of Onset  . Heart disease Mother   . Diabetes Mother   . Hypertension Father     Surgical History Past Surgical  History  Procedure Laterality Date  . Cataract extraction, bilateral    . Cardiac catheterization      > 5 YRS SEES DR DEGENT ([facility])  . Endovascular stent insertion  09/11/2011    Procedure: ENDOVASCULAR STENT GRAFT INSERTION;  Surgeon: Larina Earthlyodd F Early, MD;  [facility];  Service: Vascular;  Laterality: Bilateral;  . Carotid endarterectomy  04/2009    Dr. Langston MaskerMorris no stenosis of the left internal carotid artery and patent right internal carotid artery    No Known Allergies  Current Outpatient Prescriptions  Medication Sig Dispense Refill  . allopurinol (ZYLOPRIM) 300 MG tablet Take 1 tablet by mouth Daily.      Marland Kitchen. aspirin 81 MG chewable tablet Chew 81 mg by mouth daily.        Marland Kitchen. atenolol (TENORMIN) 50 MG tablet Take 3 tablets by mouth Daily.      Marland Kitchen. COLCRYS 0.6 MG tablet Take 1 tablet by mouth Every 1 hour. Until gout relieves      . fish oil-omega-3 fatty acids 1000 MG capsule Take 1 g by mouth daily.        . hydrochlorothiazide (,MICROZIDE/HYDRODIURIL,) 12.5 MG capsule Take 1 capsule by mouth Daily.      . Insulin Lispro, Human, (HUMALOG [place]) Inject into the skin. Sliding Scale  Between 3 - 6 units      . isosorbide mononitrate (IMDUR) 60 MG 24 hr tablet TAKE 1 TABLET BY MOUTH  EVERY DAY  30 tablet  6  . LANTUS 100 UNIT/ML injection Inject 50 Units into the skin daily.       Marland Kitchen losartan (COZAAR) 25 MG tablet Take 25 mg by mouth. 3 times per week      . nitroGLYCERIN (NITROSTAT) 0.4 MG SL tablet Place 1 tablet (0.4 mg total) under the tongue every 5 (five) minutes x 3 doses as needed. For chest pain  25 tablet  3  . pravastatin (PRAVACHOL) 40 MG tablet Take 40 mg by mouth daily.      . CELEBREX 200 MG capsule Take 1 capsule by mouth as needed.      Marland Kitchen HUMULIN R 100 UNIT/ML injection Inject into the skin. Sliding scale       No current facility-administered medications for this visit.    Review of Systems : See HPI for pertinent positives and negatives.  Physical Examination  Filed  Vitals:   07/26/14 1128 07/26/14 1131  BP: 124/78 137/85  Pulse: 65 65  Resp:  16  Height:  5\' 10"  (1.778 m)  Weight:  194 lb (87.998 kg)  SpO2:  99%   Body mass index is 27.84 kg/(m^2).  General: WDWN male in NAD, with wife GAIT: normal Eyes: PERRLA Pulmonary:  Non-labored, Positive  Rales in bases, Negative rhonchi, & Negative wheezing.  Cardiac: regular Rhythm,  Negative detected murmur.  VASCULAR EXAM Carotid Bruits Right Left   Negative Negative    Aorta is not palpable. Radial pulses are 2+ palpable and equal.                                                                                                                            LE Pulses Right Left       FEMORAL   palpable   palpable        POPLITEAL  not palpable   not palpable       POSTERIOR TIBIAL   palpable   not palpable        DORSALIS PEDIS      ANTERIOR TIBIAL not palpable  not palpable     Gastrointestinal: soft, nontender, BS WNL, no r/g,  no palpated masses.  Musculoskeletal: Negative muscle atrophy/wasting. M/S 5/5 throughout, Extremities without ischemic changes.  Neurologic: A&O X 3; Appropriate Affect ; SENSATION ;normal;  Speech is normal CN 2-12 intact except is hard of hearing, motor exam as listed above.   Non-Invasive Vascular Imaging CAROTID DUPLEX 07/26/2014   CEREBROVASCULAR DUPLEX EVALUATION    INDICATION: Carotid artery disease    PREVIOUS INTERVENTION(S): Right carotid endarterectomy 05/01/2009    DUPLEX EXAM: Carotid duplex    RIGHT  LEFT  Peak Systolic Velocities (cm/s) End Diastolic Velocities (cm/s) Plaque LOCATION Peak Systolic Velocities (cm/s) End Diastolic Velocities (cm/s) Plaque  71 18 HT CCA PROXIMAL 94 12 HT  78 15 HT CCA MID 94 16 -  99 19 CP CCA DISTAL 79 17 HT  71 11 -  ECA 92 9 HT  61 15 - ICA PROXIMAL 61 16 CP  69 20 - ICA MID 79 27 -  93 34 - ICA DISTAL 65 17 -    N/A ICA / CCA Ratio (PSV) .84  Antegrade Vertebral Flow Antegrade  144 Brachial  Systolic Pressure (mmHg) 150  Triphasic Brachial Artery Waveforms Triphasic    Plaque Morphology:  HM = Homogeneous, HT = Heterogeneous, CP = Calcific Plaque, SP = Smooth Plaque, IP = Irregular Plaque  ADDITIONAL FINDINGS:     IMPRESSION: 1. Patent right carotid endarterectomy site with no evidence for restenosis. 2. Less than 40% left internal carotid artery stenosis.    Compared to the previous exam:  No significant change       Assessment: Luke [name] is a 80 y.o. male who is s/p right carotid endarterectomy on 05/01/2009. He has no history of stroke or TIA. Carotid Duplex today indicates a patent right carotid endarterectomy site with no evidence for restenosis and less than 40% left internal carotid artery stenosis. The  ICA stenosis is  Unchanged from previous exam.  Dr. Bosie HelperEarly's progress note from October, 2014 indicates we will see him again in one year with carotid duplex, in 2 years with CT angiogram of the stent graft.  CTA  from October 2014 revealed excellent positioning of the stent graft and no evidence of endoleak.   Plan: Follow-up in 1 year with Carotid Duplex scan and CTA of EVAR. Discussed with Dr. Arbie CookeyEarly re continued evaluation of EVAR after next year: If EVAR is stable in one year may evaluate by yearly Duplex.  I discussed in depth with the patient the nature of atherosclerosis, and emphasized the importance of maximal medical management including strict control of blood pressure, blood glucose, and lipid levels, obtaining regular exercise, and continued cessation of smoking.  The patient is aware that without maximal medical management the underlying atherosclerotic disease process will progress, limiting the benefit of any interventions. The patient was given information about stroke prevention and what symptoms should prompt the patient to seek immediate medical care. Thank you for allowing us to participate in this patient's care.  Charisse MarchSuzanne TRUE Garciamartinez, RN, MSN,  FNP-C [facility] Office: [phone]  Clinic Physician: Early  07/26/2014 11:40 AM

## 2014-09-19 ENCOUNTER — Ambulatory Visit (INDEPENDENT_AMBULATORY_CARE_PROVIDER_SITE_OTHER): Payer: Medicare Other | Admitting: Cardiology

## 2014-09-19 ENCOUNTER — Encounter: Payer: Self-pay | Admitting: Cardiology

## 2014-09-19 ENCOUNTER — Encounter: Payer: Self-pay | Admitting: *Deleted

## 2014-09-19 VITALS — BP 112/69 | HR 60 | Ht 70.0 in | Wt 192.0 lb

## 2014-09-19 DIAGNOSIS — I739 Peripheral vascular disease, unspecified: Secondary | ICD-10-CM

## 2014-09-19 DIAGNOSIS — E782 Mixed hyperlipidemia: Secondary | ICD-10-CM

## 2014-09-19 DIAGNOSIS — I2510 Atherosclerotic heart disease of native coronary artery without angina pectoris: Secondary | ICD-10-CM

## 2014-09-19 DIAGNOSIS — I10 Essential (primary) hypertension: Secondary | ICD-10-CM

## 2014-09-19 MED ORDER — NITROGLYCERIN 0.4 MG SL SUBL: 0.4000 mg | SUBLINGUAL_TABLET | SUBLINGUAL | Status: DC | PRN

## 2014-09-19 NOTE — Assessment & Plan Note (Signed)
Continues on aspirin and statin. Keep follow-up with Dr. Arbie CookeyEarly

## 2014-09-19 NOTE — Assessment & Plan Note (Signed)
Blood pressure is well controlled today. No changes made to current regimen. 

## 2014-09-19 NOTE — Progress Notes (Signed)
Reason for visit: CAD, hyperlipidemia, hypertension  Clinical Summary [name] is an 80 y.o.male last seen in December 2014. He is here with his wife today for a routine visit. He has had no escalating angina symptoms with stable NYHA class II dyspnea on exertion. No nitroglycerin requirement on medical therapy. He continues on aspirin, Tenormin, Imdur, Pravachol, and Cozaar. ECG today shows sinus rhythm with nonspecific IVCD.  He continues to follow with Dr. Arbie [name] with history of right carotid endarterectomy as well as stent graft repair of aortic aneurysm. Both issues remain stable by imaging as of October.  He reports having interval lab work with Dr. Neita [name], results to be requested. No change in lipid management noted.  Blood pressure is well controlled today on Cozaar and Tenormin as well as hydrochlorothiazide.  Lexiscan Cardiolite from 2011 showed no diagnostic ST segment changes, LVEF 61%, and a moderate inferior wall scar consistent with combination of scar and peri-infarct ischemia. We discussed arranging follow-up ischemic testing to reassess ischemic burden.  No Known Allergies  Current Outpatient Prescriptions  Medication Sig Dispense Refill  . allopurinol (ZYLOPRIM) 300 MG tablet Take 1 tablet by mouth Daily.    Marland Kitchen. aspirin 81 MG chewable tablet Chew 81 mg by mouth daily.      Marland Kitchen. atenolol (TENORMIN) 50 MG tablet Take 3 tablets by mouth Daily.    Marland Kitchen. COLCRYS 0.6 MG tablet Take 1 tablet by mouth Every 1 hour. Until gout relieves    . fish oil-omega-3 fatty acids 1000 MG capsule Take 1 g by mouth daily.      Marland Kitchen. HUMULIN R 100 UNIT/ML injection Inject into the skin. Sliding scale    . hydrochlorothiazide (,MICROZIDE/HYDRODIURIL,) 12.5 MG capsule Take 1 capsule by mouth Daily.    . Insulin Glargine (TOUJEO SOLOSTAR [place]) Inject 50 Units into the skin every morning.    . Insulin Lispro, Human, (HUMALOG [place]) Inject into the skin. Sliding Scale  Between 3 - 6 units    . isosorbide  mononitrate (IMDUR) 60 MG 24 hr tablet TAKE 1 TABLET BY MOUTH EVERY DAY 30 tablet 6  . losartan (COZAAR) 25 MG tablet Take 25 mg by mouth. 3 times per week    . meloxicam (MOBIC) 7.5 MG tablet Take 7.5 mg by mouth daily as needed for pain.    . nitroGLYCERIN (NITROSTAT) 0.4 MG SL tablet Place 1 tablet (0.4 mg total) under the tongue every 5 (five) minutes x 3 doses as needed. For chest pain 25 tablet 3  . pravastatin (PRAVACHOL) 40 MG tablet Take 40 mg by mouth daily.     No current facility-administered medications for this visit.    Past Medical History  Diagnosis Date  . Abdominal aortic aneurysm     Status post stent graft repair December 2012 - Dr. Arbie [name]  . Essential hypertension, benign   . Hyperlipidemia   . Coronary atherosclerosis of native coronary artery     Occluded RCA with left-to-right collaterals  . Type 2 diabetes mellitus   . Myocardial infarction 1995  . Gout   . Carotid artery occlusion   . Arthritis     Past Surgical History  Procedure Laterality Date  . Cataract extraction, bilateral    . Endovascular stent insertion  09/11/2011    Procedure: ENDOVASCULAR STENT GRAFT INSERTION;  Surgeon: Larina Earthlyodd F Early, MD;  [facility] OR;  Service: Vascular;  Laterality: Bilateral;  . Carotid endarterectomy  04/2009    Dr. Langston MaskerMorris no stenosis of the left internal  carotid artery and patent right internal carotid artery    Social History [name] reports that he quit smoking about 37 years ago. His smoking use included Cigarettes. He started smoking about 71 years ago. He has a 60 pack-year smoking history. He has never used smokeless tobacco. [name] reports that he does not drink alcohol.  Review of Systems Complete review of systems negative except as otherwise outlined in the clinical summary and also the following. No palpitations, dizziness, syncope, unusual bleeding episodes, claudication. Hard of hearing.  Physical Examination Filed Vitals:   09/19/14 1316    BP: 112/69  Pulse: 60   Filed Weights   09/19/14 1316  Weight: 192 lb (87.091 kg)   Appears comfortable at rest. HEENT: Conjunctiva and lids normal, oropharynx clear.  Neck: Supple, no elevated JVP, no significant carotid bruits, no thyromegaly.  Lungs: Clear to auscultation, nonlabored breathing at rest.  Cardiac: Regular rate and rhythm, no S3 or significant systolic murmur, no pericardial rub.  Abdomen: Soft, nontender, bowel sounds present.  Extremities: No pitting edema, distal pulses 2+.  Skin: Warm and dry.  Musculoskeletal: No kyphosis.  Neuropsychiatric: Alert and oriented x3, affect grossly appropriate.   Problem List and Plan   CAD, NATIVE VESSEL Plan is to continue medical therapy and follow-up with a Lexiscan Cardiolite to reassess ischemic burden. Last evaluation was approximately 4 years ago with evidence of moderate-sized region of inferior scar and peri-infarct ischemia. Unless there has been substantial progression, we will continue medical therapy. Refill provided for nitroglycerin.  Essential hypertension, benign Blood pressure is well controlled today. No changes made to current regimen.  PVD (peripheral vascular disease) Continues on aspirin and statin. Keep follow-up with Dr. Arbie [name]  Mixed hyperlipidemia Continues on Pravachol. Will request most recent lab work from Dr. Neita [name].    Luke [name], M.D., F.A.C.C.

## 2014-09-19 NOTE — Assessment & Plan Note (Signed)
Continues on Pravachol. Will request most recent lab work from Dr. Neita CarpSasser.

## 2014-09-19 NOTE — Patient Instructions (Signed)
Your physician recommends that you schedule a follow-up appointment in: 1 year. You will receive a reminder letter in the mail in about 10 months reminding you to call and schedule your appointment. If you don't receive this letter, please contact our office. Your physician recommends that you continue on your current medications as directed. Please refer to the Current Medication list given to you today. Your physician has requested that you have a lexiscan myoview. For further information please visit [email or web address]. Please follow instruction sheet, as given.

## 2014-09-19 NOTE — Assessment & Plan Note (Signed)
Plan is to continue medical therapy and follow-up with a Lexiscan Cardiolite to reassess ischemic burden. Last evaluation was approximately 4 years ago with evidence of moderate-sized region of inferior scar and peri-infarct ischemia. Unless there has been substantial progression, we will continue medical therapy. Refill provided for nitroglycerin.

## 2014-09-28 ENCOUNTER — Encounter (HOSPITAL_COMMUNITY)
Admission: RE | Admit: 2014-09-28 | Discharge: 2014-09-28 | Disposition: A | Discharge status: Home / Self Care | Payer: Medicare Other | Source: Ambulatory Visit | Attending: Cardiology | Admitting: Cardiology

## 2014-09-28 ENCOUNTER — Encounter (HOSPITAL_COMMUNITY): Payer: Self-pay

## 2014-09-28 ENCOUNTER — Ambulatory Visit (HOSPITAL_COMMUNITY)
Admission: RE | Admit: 2014-09-28 | Discharge: 2014-09-28 | Disposition: A | Discharge status: Home / Self Care | Payer: Medicare Other | Source: Ambulatory Visit | Attending: Cardiology | Admitting: Cardiology

## 2014-09-28 DIAGNOSIS — R0602 Shortness of breath: Secondary | ICD-10-CM | POA: Diagnosis not present

## 2014-09-28 DIAGNOSIS — I2510 Atherosclerotic heart disease of native coronary artery without angina pectoris: Secondary | ICD-10-CM | POA: Diagnosis present

## 2014-09-28 MED ORDER — TECHNETIUM TC 99M SESTAMIBI - CARDIOLITE
30.0000 | Freq: Once | INTRAVENOUS | Status: AC | PRN
  Administered 2014-09-28: 11:00:00 30 via INTRAVENOUS

## 2014-09-28 MED ORDER — TECHNETIUM TC 99M SESTAMIBI GENERIC - CARDIOLITE
10.0000 | Freq: Once | INTRAVENOUS | Status: AC | PRN
  Administered 2014-09-28: 10 via INTRAVENOUS

## 2014-09-28 MED ORDER — REGADENOSON 0.4 MG/5ML IV SOLN
0.4000 mg | Freq: Once | INTRAVENOUS | Status: AC | PRN
Start: 2014-09-28 — End: 2014-09-28
  Administered 2014-09-28: 0.4 mg via INTRAVENOUS

## 2014-09-28 MED ORDER — SODIUM CHLORIDE 0.9 % IJ SOLN
INTRAMUSCULAR | Status: AC
  Administered 2014-09-28: 10 mL via INTRAVENOUS
  Filled 2014-09-28: qty 10

## 2014-09-28 MED ORDER — SODIUM CHLORIDE 0.9 % IJ SOLN
10.0000 mL | INTRAMUSCULAR | Status: DC | PRN
  Administered 2014-09-28: 10 mL via INTRAVENOUS
  Filled 2014-09-28: qty 10

## 2014-09-28 MED ORDER — REGADENOSON 0.4 MG/5ML IV SOLN
INTRAVENOUS | Status: AC
  Administered 2014-09-28: 0.4 mg via INTRAVENOUS
  Filled 2014-09-28: qty 5

## 2014-09-28 NOTE — Progress Notes (Signed)
Stress Lab Nurses Notes - [facility]  Nilsa NuttingCurtis W Gayton 09/28/2014 Reason for doing test: CAD Type of test: Marlane HatcherLexiscan Cardiolite Nurse performing test: Parke PoissonPhyllis Billingsly, RN Nuclear Medicine Tech: Lyndel Pleasureyan Liles Echo Tech: Not Applicable MD performing test: P. Ross/D.Dunn PA Family MD: Sasser Test explained and consent signed: Yes.   IV started: Saline lock flushed, No redness or edema and Saline lock started in radiology Symptoms: SOB Treatment/Intervention: None Reason test stopped: protocol completed After recovery IV was: Discontinued via X-ray tech and No redness or edema Patient to return to Nuc. Med at : 11:45 Patient discharged: Home Patient's Condition upon discharge was: stable Comments: During test BP 161/101 & HR 84.  Recovery BP 166/90 & HR 72.  Symptoms resolved in recovery. Erskine SpeedBillingsley, Seraphine Gudiel T

## 2014-10-03 ENCOUNTER — Encounter: Payer: Self-pay | Admitting: *Deleted

## 2014-11-14 DIAGNOSIS — I10 Essential (primary) hypertension: Secondary | ICD-10-CM | POA: Diagnosis not present

## 2014-11-14 DIAGNOSIS — E780 Pure hypercholesterolemia: Secondary | ICD-10-CM | POA: Diagnosis not present

## 2014-11-14 DIAGNOSIS — E1165 Type 2 diabetes mellitus with hyperglycemia: Secondary | ICD-10-CM | POA: Diagnosis not present

## 2014-11-14 DIAGNOSIS — N189 Chronic kidney disease, unspecified: Secondary | ICD-10-CM | POA: Diagnosis not present

## 2014-11-15 DIAGNOSIS — E780 Pure hypercholesterolemia: Secondary | ICD-10-CM | POA: Diagnosis not present

## 2014-11-21 DIAGNOSIS — I714 Abdominal aortic aneurysm, without rupture: Secondary | ICD-10-CM | POA: Diagnosis not present

## 2014-11-21 DIAGNOSIS — I10 Essential (primary) hypertension: Secondary | ICD-10-CM | POA: Diagnosis not present

## 2014-11-21 DIAGNOSIS — E780 Pure hypercholesterolemia: Secondary | ICD-10-CM | POA: Diagnosis not present

## 2014-11-21 DIAGNOSIS — Z1389 Encounter for screening for other disorder: Secondary | ICD-10-CM | POA: Diagnosis not present

## 2014-11-21 DIAGNOSIS — I779 Disorder of arteries and arterioles, unspecified: Secondary | ICD-10-CM | POA: Diagnosis not present

## 2014-11-21 DIAGNOSIS — E1159 Type 2 diabetes mellitus with other circulatory complications: Secondary | ICD-10-CM | POA: Diagnosis not present

## 2014-12-19 ENCOUNTER — Other Ambulatory Visit: Payer: Self-pay | Admitting: *Deleted

## 2014-12-19 MED ORDER — ISOSORBIDE MONONITRATE ER 60 MG PO TB24: ORAL_TABLET | ORAL | Status: DC

## 2015-01-24 DIAGNOSIS — I129 Hypertensive chronic kidney disease with stage 1 through stage 4 chronic kidney disease, or unspecified chronic kidney disease: Secondary | ICD-10-CM | POA: Diagnosis not present

## 2015-01-24 DIAGNOSIS — Z79899 Other long term (current) drug therapy: Secondary | ICD-10-CM | POA: Diagnosis not present

## 2015-01-24 DIAGNOSIS — E559 Vitamin D deficiency, unspecified: Secondary | ICD-10-CM | POA: Diagnosis not present

## 2015-01-24 DIAGNOSIS — D649 Anemia, unspecified: Secondary | ICD-10-CM | POA: Diagnosis not present

## 2015-01-24 DIAGNOSIS — N183 Chronic kidney disease, stage 3 (moderate): Secondary | ICD-10-CM | POA: Diagnosis not present

## 2015-01-24 DIAGNOSIS — R809 Proteinuria, unspecified: Secondary | ICD-10-CM | POA: Diagnosis not present

## 2015-01-31 DIAGNOSIS — N179 Acute kidney failure, unspecified: Secondary | ICD-10-CM | POA: Diagnosis not present

## 2015-01-31 DIAGNOSIS — E875 Hyperkalemia: Secondary | ICD-10-CM | POA: Diagnosis not present

## 2015-01-31 DIAGNOSIS — I10 Essential (primary) hypertension: Secondary | ICD-10-CM | POA: Diagnosis not present

## 2015-01-31 DIAGNOSIS — E1129 Type 2 diabetes mellitus with other diabetic kidney complication: Secondary | ICD-10-CM | POA: Diagnosis not present

## 2015-03-15 DIAGNOSIS — E1165 Type 2 diabetes mellitus with hyperglycemia: Secondary | ICD-10-CM | POA: Diagnosis not present

## 2015-03-15 DIAGNOSIS — I10 Essential (primary) hypertension: Secondary | ICD-10-CM | POA: Diagnosis not present

## 2015-03-15 DIAGNOSIS — E780 Pure hypercholesterolemia: Secondary | ICD-10-CM | POA: Diagnosis not present

## 2015-03-15 DIAGNOSIS — N183 Chronic kidney disease, stage 3 (moderate): Secondary | ICD-10-CM | POA: Diagnosis not present

## 2015-03-22 DIAGNOSIS — E780 Pure hypercholesterolemia: Secondary | ICD-10-CM | POA: Diagnosis not present

## 2015-03-22 DIAGNOSIS — I10 Essential (primary) hypertension: Secondary | ICD-10-CM | POA: Diagnosis not present

## 2015-03-22 DIAGNOSIS — E1159 Type 2 diabetes mellitus with other circulatory complications: Secondary | ICD-10-CM | POA: Diagnosis not present

## 2015-03-22 DIAGNOSIS — I714 Abdominal aortic aneurysm, without rupture: Secondary | ICD-10-CM | POA: Diagnosis not present

## 2015-03-22 DIAGNOSIS — I779 Disorder of arteries and arterioles, unspecified: Secondary | ICD-10-CM | POA: Diagnosis not present

## 2015-03-23 DIAGNOSIS — I10 Essential (primary) hypertension: Secondary | ICD-10-CM | POA: Diagnosis not present

## 2015-04-22 DIAGNOSIS — M1990 Unspecified osteoarthritis, unspecified site: Secondary | ICD-10-CM | POA: Diagnosis not present

## 2015-05-23 DIAGNOSIS — M1990 Unspecified osteoarthritis, unspecified site: Secondary | ICD-10-CM | POA: Diagnosis not present

## 2015-06-23 DIAGNOSIS — I10 Essential (primary) hypertension: Secondary | ICD-10-CM | POA: Diagnosis not present

## 2015-06-27 DIAGNOSIS — I129 Hypertensive chronic kidney disease with stage 1 through stage 4 chronic kidney disease, or unspecified chronic kidney disease: Secondary | ICD-10-CM | POA: Diagnosis not present

## 2015-06-27 DIAGNOSIS — N183 Chronic kidney disease, stage 3 (moderate): Secondary | ICD-10-CM | POA: Diagnosis not present

## 2015-06-27 DIAGNOSIS — R809 Proteinuria, unspecified: Secondary | ICD-10-CM | POA: Diagnosis not present

## 2015-06-27 DIAGNOSIS — E559 Vitamin D deficiency, unspecified: Secondary | ICD-10-CM | POA: Diagnosis not present

## 2015-06-27 DIAGNOSIS — D649 Anemia, unspecified: Secondary | ICD-10-CM | POA: Diagnosis not present

## 2015-06-27 DIAGNOSIS — Z79899 Other long term (current) drug therapy: Secondary | ICD-10-CM | POA: Diagnosis not present

## 2015-07-04 DIAGNOSIS — N183 Chronic kidney disease, stage 3 (moderate): Secondary | ICD-10-CM | POA: Diagnosis not present

## 2015-07-04 DIAGNOSIS — I10 Essential (primary) hypertension: Secondary | ICD-10-CM | POA: Diagnosis not present

## 2015-07-04 DIAGNOSIS — E1129 Type 2 diabetes mellitus with other diabetic kidney complication: Secondary | ICD-10-CM | POA: Diagnosis not present

## 2015-07-04 DIAGNOSIS — E875 Hyperkalemia: Secondary | ICD-10-CM | POA: Diagnosis not present

## 2015-07-11 DIAGNOSIS — R079 Chest pain, unspecified: Secondary | ICD-10-CM | POA: Diagnosis not present

## 2015-07-11 DIAGNOSIS — I509 Heart failure, unspecified: Secondary | ICD-10-CM | POA: Diagnosis not present

## 2015-07-21 DIAGNOSIS — E7800 Pure hypercholesterolemia, unspecified: Secondary | ICD-10-CM | POA: Diagnosis not present

## 2015-07-21 DIAGNOSIS — N183 Chronic kidney disease, stage 3 (moderate): Secondary | ICD-10-CM | POA: Diagnosis not present

## 2015-07-21 DIAGNOSIS — E1122 Type 2 diabetes mellitus with diabetic chronic kidney disease: Secondary | ICD-10-CM | POA: Diagnosis not present

## 2015-07-21 DIAGNOSIS — I10 Essential (primary) hypertension: Secondary | ICD-10-CM | POA: Diagnosis not present

## 2015-07-27 ENCOUNTER — Encounter: Payer: Self-pay | Admitting: Vascular Surgery

## 2015-07-27 ENCOUNTER — Other Ambulatory Visit: Payer: Self-pay | Admitting: *Deleted

## 2015-07-27 DIAGNOSIS — E7800 Pure hypercholesterolemia, unspecified: Secondary | ICD-10-CM | POA: Diagnosis not present

## 2015-07-27 DIAGNOSIS — I714 Abdominal aortic aneurysm, without rupture: Secondary | ICD-10-CM | POA: Diagnosis not present

## 2015-07-27 DIAGNOSIS — E1122 Type 2 diabetes mellitus with diabetic chronic kidney disease: Secondary | ICD-10-CM | POA: Diagnosis not present

## 2015-07-27 DIAGNOSIS — Z23 Encounter for immunization: Secondary | ICD-10-CM | POA: Diagnosis not present

## 2015-07-27 DIAGNOSIS — I779 Disorder of arteries and arterioles, unspecified: Secondary | ICD-10-CM | POA: Diagnosis not present

## 2015-07-27 DIAGNOSIS — I6523 Occlusion and stenosis of bilateral carotid arteries: Secondary | ICD-10-CM

## 2015-07-27 DIAGNOSIS — Z48812 Encounter for surgical aftercare following surgery on the circulatory system: Secondary | ICD-10-CM

## 2015-07-27 DIAGNOSIS — I10 Essential (primary) hypertension: Secondary | ICD-10-CM | POA: Diagnosis not present

## 2015-07-27 DIAGNOSIS — E1159 Type 2 diabetes mellitus with other circulatory complications: Secondary | ICD-10-CM | POA: Diagnosis not present

## 2015-07-27 DIAGNOSIS — Z13220 Encounter for screening for lipoid disorders: Secondary | ICD-10-CM | POA: Diagnosis not present

## 2015-07-27 DIAGNOSIS — N183 Chronic kidney disease, stage 3 (moderate): Secondary | ICD-10-CM | POA: Diagnosis not present

## 2015-07-31 DIAGNOSIS — Z48812 Encounter for surgical aftercare following surgery on the circulatory system: Secondary | ICD-10-CM | POA: Diagnosis not present

## 2015-07-31 DIAGNOSIS — I6523 Occlusion and stenosis of bilateral carotid arteries: Secondary | ICD-10-CM | POA: Diagnosis not present

## 2015-07-31 DIAGNOSIS — Z01812 Encounter for preprocedural laboratory examination: Secondary | ICD-10-CM | POA: Diagnosis not present

## 2015-08-01 ENCOUNTER — Ambulatory Visit (HOSPITAL_COMMUNITY)
Admission: RE | Admit: 2015-08-01 | Discharge: 2015-08-01 | Disposition: A | Discharge status: Home / Self Care | Payer: Medicare Other | Source: Ambulatory Visit | Attending: Vascular Surgery | Admitting: Vascular Surgery

## 2015-08-01 ENCOUNTER — Ambulatory Visit (INDEPENDENT_AMBULATORY_CARE_PROVIDER_SITE_OTHER): Payer: Medicare Other | Admitting: Vascular Surgery

## 2015-08-01 ENCOUNTER — Ambulatory Visit
Admission: RE | Admit: 2015-08-01 | Discharge: 2015-08-01 | Disposition: A | Discharge status: Home / Self Care | Payer: Medicare Other | Source: Ambulatory Visit | Attending: Vascular Surgery | Admitting: Vascular Surgery

## 2015-08-01 ENCOUNTER — Encounter: Payer: Self-pay | Admitting: Vascular Surgery

## 2015-08-01 ENCOUNTER — Other Ambulatory Visit: Payer: Self-pay | Admitting: *Deleted

## 2015-08-01 VITALS — BP 136/67 | HR 57 | Temp 97.3°F | Resp 16 | Ht 70.0 in | Wt 185.9 lb

## 2015-08-01 DIAGNOSIS — I6523 Occlusion and stenosis of bilateral carotid arteries: Secondary | ICD-10-CM

## 2015-08-01 DIAGNOSIS — I714 Abdominal aortic aneurysm, without rupture, unspecified: Secondary | ICD-10-CM

## 2015-08-01 DIAGNOSIS — Z48812 Encounter for surgical aftercare following surgery on the circulatory system: Secondary | ICD-10-CM

## 2015-08-01 DIAGNOSIS — E785 Hyperlipidemia, unspecified: Secondary | ICD-10-CM | POA: Diagnosis not present

## 2015-08-01 DIAGNOSIS — E119 Type 2 diabetes mellitus without complications: Secondary | ICD-10-CM | POA: Diagnosis not present

## 2015-08-01 DIAGNOSIS — I252 Old myocardial infarction: Secondary | ICD-10-CM | POA: Insufficient documentation

## 2015-08-01 DIAGNOSIS — I10 Essential (primary) hypertension: Secondary | ICD-10-CM | POA: Insufficient documentation

## 2015-08-01 DIAGNOSIS — I6522 Occlusion and stenosis of left carotid artery: Secondary | ICD-10-CM | POA: Insufficient documentation

## 2015-08-01 MED ORDER — ISOSORBIDE MONONITRATE ER 60 MG PO TB24: ORAL_TABLET | ORAL | Status: DC

## 2015-08-01 MED ORDER — IOPAMIDOL (ISOVUE-370) INJECTION 76%
50.0000 mL | Freq: Once | INTRAVENOUS | Status: AC | PRN
  Administered 2015-08-01: 50 mL via INTRAVENOUS

## 2015-08-01 NOTE — Progress Notes (Signed)
Patient presents today for follow-up of right carotid endarterectomy 2010 and stent graft repair of abdominal aortic aneurysm 2012. He is here today with his wife. He looks quite good. He is active and able to do his usual activities with no limitation. He denies any cardiac difficulty and denies any stroke symptoms. He has no symptoms referable to his aneurysm  Past Medical History  Diagnosis Date  . Abdominal aortic aneurysm [facility])     Status post stent graft repair December 2012 - Dr. Arbie Cookey  . Essential hypertension, benign   . Hyperlipidemia   . Coronary atherosclerosis of native coronary artery     Occluded RCA with left-to-right collaterals  . Type 2 diabetes mellitus (HCC)   . Myocardial infarction (HCC) 1995  . Gout   . Carotid artery occlusion   . Arthritis     Social History  Substance Use Topics  . Smoking status: Former Smoker -- 2.00 packs/day for 30 years    Types: Cigarettes    Start date: 03/18/1943    Quit date: 10/27/1976  . Smokeless tobacco: Never Used  . Alcohol Use: No    Family History  Problem Relation Age of Onset  . Heart disease Mother   . Diabetes Mother   . Hypertension Father     No Known Allergies   Current outpatient prescriptions:  .  allopurinol (ZYLOPRIM) 300 MG tablet, Take 1 tablet by mouth Daily., Disp: , Rfl:  .  aspirin 81 MG chewable tablet, Chew 81 mg by mouth daily.  , Disp: , Rfl:  .  atenolol (TENORMIN) 50 MG tablet, Take 3 tablets by mouth Daily., Disp: , Rfl:  .  COLCRYS 0.6 MG tablet, Take 1 tablet by mouth Every 1 hour. Until gout relieves, Disp: , Rfl:  .  fish oil-omega-3 fatty acids 1000 MG capsule, Take 1 g by mouth daily.  , Disp: , Rfl:  .  hydrochlorothiazide (,MICROZIDE/HYDRODIURIL,) 12.5 MG capsule, Take 1 capsule by mouth Daily., Disp: , Rfl:  .  Insulin Glargine (TOUJEO SOLOSTAR [place]), Inject 50 Units into the skin every morning., Disp: , Rfl:  .  Insulin Lispro, Human, (HUMALOG [place]), Inject into the skin. Sliding  Scale  Between 3 - 6 units, Disp: , Rfl:  .  isosorbide mononitrate (IMDUR) 60 MG 24 hr tablet, TAKE 1 TABLET BY MOUTH EVERY DAY, Disp: 30 tablet, Rfl: 6 .  losartan (COZAAR) 25 MG tablet, Take 25 mg by mouth. 3 times per week, Disp: , Rfl:  .  meloxicam (MOBIC) 7.5 MG tablet, Take 7.5 mg by mouth daily as needed for pain., Disp: , Rfl:  .  nitroGLYCERIN (NITROSTAT) 0.4 MG SL tablet, Place 1 tablet (0.4 mg total) under the tongue every 5 (five) minutes x 3 doses as needed. For chest pain, Disp: 25 tablet, Rfl: 3 .  pravastatin (PRAVACHOL) 40 MG tablet, Take 40 mg by mouth daily., Disp: , Rfl:  .  HUMULIN R 100 UNIT/ML injection, Inject into the skin. Sliding scale, Disp: , Rfl:   Filed Vitals:   08/01/15 1201 08/01/15 1207  BP: 156/67 136/67  Pulse: 57   Temp: 97.3 F (36.3 C)   TempSrc: Oral   Resp: 16   Height:  (1.778 m)   Weight: 185 lb 14.4 oz (84.324 kg)   SpO2: 99%     Body mass index is 26.67 kg/(m^2).       On physical exam his carotid arteries without bruits bilaterally. He has no neurologic deficits Abdomen soft  nontender with no masses noted Neurologically he is grossly  Carotid duplex today reveals widely patent endarterectomy with no evidence of restenosis. No significant left carotid stenosis  Reviewed his CT angiogram. This shows excellent stent graft placement with slight diminished size compared to years ago with maximal diameter 4.8 cm.  Impression and plan stable from a standpoint of his stent graft repair of abdominal aortic aneurysm and he is right carotid endarterectomy. Will continue usual activities. We will see him again in one year with duplex of his stent graft and carotids.

## 2015-08-01 NOTE — Addendum Note (Signed)
Addended by: Adria DillELDRIDGE-LEWIS, Laneshia Pina L on: 08/01/2015 02:15 PM   Modules accepted: Orders

## 2015-08-01 NOTE — Progress Notes (Signed)
Filed Vitals:   08/01/15 1201 08/01/15 1207  BP: 156/67 136/67  Pulse: 57   Temp: 97.3 F (36.3 C)   TempSrc: Oral   Resp: 16   Height: 5\' 10"  (1.778 m)   Weight: 185 lb 14.4 oz (84.324 kg)   SpO2: 99%

## 2015-08-11 ENCOUNTER — Other Ambulatory Visit: Payer: Medicare Other

## 2015-09-04 ENCOUNTER — Encounter: Payer: Self-pay | Admitting: Vascular Surgery

## 2015-09-27 ENCOUNTER — Encounter: Payer: Self-pay | Admitting: Cardiology

## 2015-09-27 ENCOUNTER — Ambulatory Visit (INDEPENDENT_AMBULATORY_CARE_PROVIDER_SITE_OTHER): Payer: Medicare Other | Admitting: Cardiology

## 2015-09-27 VITALS — BP 118/84 | HR 60 | Ht 72.0 in | Wt 186.0 lb

## 2015-09-27 DIAGNOSIS — I2510 Atherosclerotic heart disease of native coronary artery without angina pectoris: Secondary | ICD-10-CM | POA: Diagnosis not present

## 2015-09-27 DIAGNOSIS — I10 Essential (primary) hypertension: Secondary | ICD-10-CM

## 2015-09-27 DIAGNOSIS — E782 Mixed hyperlipidemia: Secondary | ICD-10-CM

## 2015-09-27 NOTE — Progress Notes (Signed)
Cardiology Office Note  Date: 09/27/2015   ID: ABDISHAKUR [name], DOB 09/25/1929, MRN [id number]  PCP: Estanislado Pandy, MD  Primary Cardiologist: Nona Dell, MD   Chief Complaint  Patient presents with  . Coronary Artery Disease    History of Present Illness: Luke [name] is an 81 y.o. male last seen in December 2015. He is here today with his wife for a follow-up visit. Overall he has been stable from a cardiac perspective, no active angina symptoms or nitroglycerin use.  He continues to follow with Dr. Arbie Cookey, I reviewed the recent note from November as well as the abdominal CT results.  ECG today shows sinus rhythm with increased voltage, nonspecific T-wave changes.  He had updated ischemic testing last year with Cardiolite showing area of probable inferior/inferolateral scar and minimal peri-infarct ischemia, LVEF normal range. Would continue medical therapy and observation.  Past Medical History  Diagnosis Date  . Abdominal aortic aneurysm [facility])     Status post stent graft repair December 2012 - Dr. Arbie Cookey  . Essential hypertension, benign   . Hyperlipidemia   . Coronary atherosclerosis of native coronary artery     Occluded RCA with left-to-right collaterals  . Type 2 diabetes mellitus (HCC)   . Myocardial infarction (HCC) 1995  . Gout   . Carotid artery occlusion   . Arthritis     Current Outpatient Prescriptions  Medication Sig Dispense Refill  . allopurinol (ZYLOPRIM) 300 MG tablet Take 1 tablet by mouth Daily.    Marland Kitchen aspirin 81 MG chewable tablet Chew 81 mg by mouth daily.      Marland Kitchen atenolol (TENORMIN) 50 MG tablet Take 3 tablets by mouth Daily.    Marland Kitchen COLCRYS 0.6 MG tablet Take 1 tablet by mouth Every 1 hour. Until gout relieves    . fish oil-omega-3 fatty acids 1000 MG capsule Take 1 g by mouth daily.      Marland Kitchen HUMULIN R 100 UNIT/ML injection Inject into the skin. Sliding scale    . hydrochlorothiazide (,MICROZIDE/HYDRODIURIL,) 12.5 MG capsule Take 1 capsule by  mouth Daily.    . Insulin Glargine (TOUJEO SOLOSTAR [place]) Inject 44 Units into the skin every morning.     . Insulin Lispro, Human, (HUMALOG [place]) Inject into the skin. Sliding Scale  Between 3 - 6 units    . isosorbide mononitrate (IMDUR) 60 MG 24 hr tablet TAKE 1 TABLET BY MOUTH EVERY DAY 30 tablet 6  . losartan (COZAAR) 25 MG tablet Take 25 mg by mouth. 3 times per week    . meloxicam (MOBIC) 7.5 MG tablet Take 7.5 mg by mouth daily as needed for pain.    . nitroGLYCERIN (NITROSTAT) 0.4 MG SL tablet Place 1 tablet (0.4 mg total) under the tongue every 5 (five) minutes x 3 doses as needed. For chest pain 25 tablet 3  . pravastatin (PRAVACHOL) 40 MG tablet Take 40 mg by mouth daily.     No current facility-administered medications for this visit.   Allergies:  Review of patient's allergies indicates no known allergies.   Social History: The patient  reports that he quit smoking about 38 years ago. His smoking use included Cigarettes. He started smoking about 72 years ago. He has a 60 pack-year smoking history. He has never used smokeless tobacco. He reports that he does not drink alcohol or use illicit drugs.   ROS:  Please see the history of present illness. Otherwise, complete review of systems is positive for decreased hearing, gout  pain in his feet intermittently.  All other systems are reviewed and negative.   Physical Exam: VS:  BP 118/84 mmHg  Pulse 60  Ht 6' (1.829 m)  Wt 186 lb (84.369 kg)  BMI 25.22 kg/m2  SpO2 98%, BMI Body mass index is 25.22 kg/(m^2).  Wt Readings from Last 3 Encounters:  09/27/15 186 lb (84.369 kg)  08/01/15 185 lb 14.4 oz (84.324 kg)  09/19/14 192 lb (87.091 kg)    Elderly male, appears comfortable at rest. HEENT: Conjunctiva and lids normal, oropharynx clear.  Neck: Supple, no elevated JVP, no significant carotid bruits, no thyromegaly.  Lungs: Clear to auscultation, nonlabored breathing at rest.  Cardiac: Regular rate and rhythm, no S3, soft  systolic murmur, no pericardial rub.  Abdomen: Soft, nontender, bowel sounds present.  Extremities: No pitting edema, distal pulses 2+.   ECG: Tracing from 09/19/2014 showed sinus rhythm with IVCD.  Recent Labwork:  October 2016: BUN 32, creatinine 1.07 July 2014: Cholesterol 125, triglycerides 72, HDL 43, LDL 68  Other Studies Reviewed Today:  Lexiscan Cardiolite 09/28/2014: FINDINGS: Stress data: Baseline EKG SR 67 bpm With infusion of Lexiscan there were no ST changes to suggest ischemia.  Nuclear data: In the initial stress images there was a moderate defect in the inferior wall (base, mid, minimally distal), inferolateral wall (base, mid, minimally distal). Otherwise normal perfusion.  IN the recovery images there was improvement in the basal inferolateral wall. On gating LVEF was calculated at 62% with basal inferior/inferolateral hypokinesis.  On review of the raw data, soft tissue (diaphragm) underlies the heart.  Impression Lexiscan Sestamibi: Electrically negative for ischemia MIBI scan with inferior/inferolateral thinning consistent with small region of scar and possible soft tissue attenuation with minimal perinfarct ischemia. LVEF 62%. Overall low risk scan No significant change from report of 2011.  Abdominal CT 08/01/2015: FINDINGS: Patient is status post stent graft repair of abdominal aortic aneurysm. The stent graft extends into the common iliac arteries. Maximum diameter of the aneurysm sac is 4.8 cm compared to 5.1 cm previously. No visible endoleak.  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 Superior mesenteric artery is widely patent. Inferior mesenteric artery is occluded at its origin.  Liver, gallbladder, spleen, pancreas, adrenals and kidneys are unremarkable. Urinary bladder and prostate grossly unremarkable. No free fluid, free air or adenopathy. Stomach, large and small bowel unremarkable.  Areas of scarring and  fibrosis in the lung bases. Heart is mildly enlarged. No effusions. No acute bony abnormality.  Review of the MIP images confirms the above findings.  IMPRESSION: Stable or slight decreased size of the aneurysm sac, 4.8 cm maximally. No evidence of endoleak.  Assessment and Plan:  1. Symptomatically stable CAD, follow-up Cardiolite study from last year overall low risk as outlined. Plan to continue medical therapy and observation.  2. Essential hypertension, blood pressure control is good today.  3. Hyperlipidemia, on Pravachol. LDL has typically been well controlled in the 60-70 range. Keep follow-up with Dr. Neita CarpSasser.  Current medicines were reviewed with the patient today.   Orders Placed This Encounter  Procedures  . EKG 12-Lead    Disposition: FU with me in 1 year.   Signed, Jonelle SidleSamuel G. Sherin Murdoch, MD, [facility] ClinicFACC 09/27/2015 11:30 AM    [facility] [address], [place], [place] [address] Phone: [phone]; Fax: [phone]

## 2015-09-27 NOTE — Patient Instructions (Signed)
Continue all current medications. Your physician wants you to follow up in:  1 year.  You will receive a reminder letter in the mail one-two months in advance.  If you don't receive a letter, please call our office to schedule the follow up appointment   

## 2015-11-02 DIAGNOSIS — N183 Chronic kidney disease, stage 3 (moderate): Secondary | ICD-10-CM | POA: Diagnosis not present

## 2015-11-02 DIAGNOSIS — Z79899 Other long term (current) drug therapy: Secondary | ICD-10-CM | POA: Diagnosis not present

## 2015-11-02 DIAGNOSIS — I129 Hypertensive chronic kidney disease with stage 1 through stage 4 chronic kidney disease, or unspecified chronic kidney disease: Secondary | ICD-10-CM | POA: Diagnosis not present

## 2015-11-02 DIAGNOSIS — R809 Proteinuria, unspecified: Secondary | ICD-10-CM | POA: Diagnosis not present

## 2015-11-02 DIAGNOSIS — E559 Vitamin D deficiency, unspecified: Secondary | ICD-10-CM | POA: Diagnosis not present

## 2015-11-02 DIAGNOSIS — D649 Anemia, unspecified: Secondary | ICD-10-CM | POA: Diagnosis not present

## 2015-11-03 DIAGNOSIS — Z79899 Other long term (current) drug therapy: Secondary | ICD-10-CM | POA: Diagnosis not present

## 2015-11-03 DIAGNOSIS — R809 Proteinuria, unspecified: Secondary | ICD-10-CM | POA: Diagnosis not present

## 2015-11-03 DIAGNOSIS — D649 Anemia, unspecified: Secondary | ICD-10-CM | POA: Diagnosis not present

## 2015-11-03 DIAGNOSIS — N183 Chronic kidney disease, stage 3 (moderate): Secondary | ICD-10-CM | POA: Diagnosis not present

## 2015-11-03 DIAGNOSIS — I129 Hypertensive chronic kidney disease with stage 1 through stage 4 chronic kidney disease, or unspecified chronic kidney disease: Secondary | ICD-10-CM | POA: Diagnosis not present

## 2015-11-03 DIAGNOSIS — E559 Vitamin D deficiency, unspecified: Secondary | ICD-10-CM | POA: Diagnosis not present

## 2015-11-07 DIAGNOSIS — N183 Chronic kidney disease, stage 3 (moderate): Secondary | ICD-10-CM | POA: Diagnosis not present

## 2015-11-07 DIAGNOSIS — I10 Essential (primary) hypertension: Secondary | ICD-10-CM | POA: Diagnosis not present

## 2015-11-07 DIAGNOSIS — E1129 Type 2 diabetes mellitus with other diabetic kidney complication: Secondary | ICD-10-CM | POA: Diagnosis not present

## 2015-11-07 DIAGNOSIS — E875 Hyperkalemia: Secondary | ICD-10-CM | POA: Diagnosis not present

## 2015-11-17 DIAGNOSIS — E7800 Pure hypercholesterolemia, unspecified: Secondary | ICD-10-CM | POA: Diagnosis not present

## 2015-11-17 DIAGNOSIS — N183 Chronic kidney disease, stage 3 (moderate): Secondary | ICD-10-CM | POA: Diagnosis not present

## 2015-11-17 DIAGNOSIS — I10 Essential (primary) hypertension: Secondary | ICD-10-CM | POA: Diagnosis not present

## 2015-11-17 DIAGNOSIS — E1165 Type 2 diabetes mellitus with hyperglycemia: Secondary | ICD-10-CM | POA: Diagnosis not present

## 2015-11-20 DIAGNOSIS — I10 Essential (primary) hypertension: Secondary | ICD-10-CM | POA: Diagnosis not present

## 2015-11-20 DIAGNOSIS — N183 Chronic kidney disease, stage 3 (moderate): Secondary | ICD-10-CM | POA: Diagnosis not present

## 2015-11-20 DIAGNOSIS — I714 Abdominal aortic aneurysm, without rupture: Secondary | ICD-10-CM | POA: Diagnosis not present

## 2015-11-20 DIAGNOSIS — I779 Disorder of arteries and arterioles, unspecified: Secondary | ICD-10-CM | POA: Diagnosis not present

## 2015-11-20 DIAGNOSIS — E1159 Type 2 diabetes mellitus with other circulatory complications: Secondary | ICD-10-CM | POA: Diagnosis not present

## 2015-12-27 IMAGING — CT CT CTA ABD/PEL W/CM AND/OR W/O CM
3 of 10 series · 10 of 36 positions shown, 15 images · IV contrast (isovue)
Comparison: 07/06/2013

CLINICAL DATA: AAA, post stent graft.

EXAM:
CTA ABDOMEN AND PELVIS wITHOUT AND WITH CONTRAST
TECHNIQUE: Multidetector CT imaging of the abdomen and pelvis was performed
using the standard protocol during bolus administration of
intravenous contrast. Multiplanar reconstructed images and MIPs were
obtained and reviewed to evaluate the vascular anatomy.
CONTRAST:  50 cc Isovue 370 IV

[Series 5: angio 2.5 · axial · 0.86mm/px · z∈[-325,-12]mm · 4 of 209 slices shown, 9 images]
[im 42/209  soft-tissue]
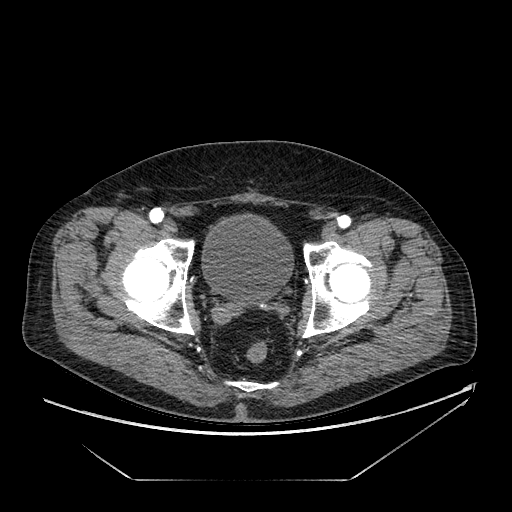
[im 42/209  lung]
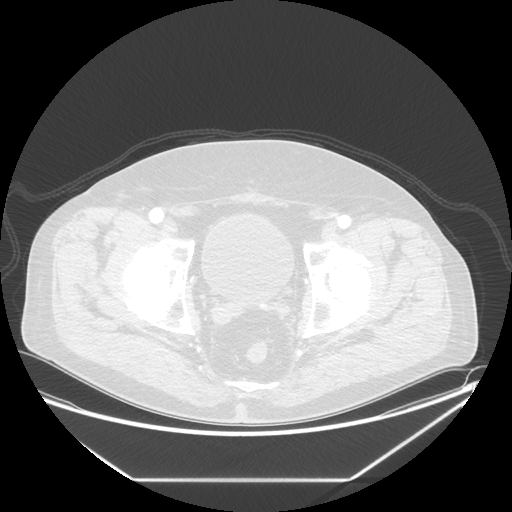
[im 42/209  bone]
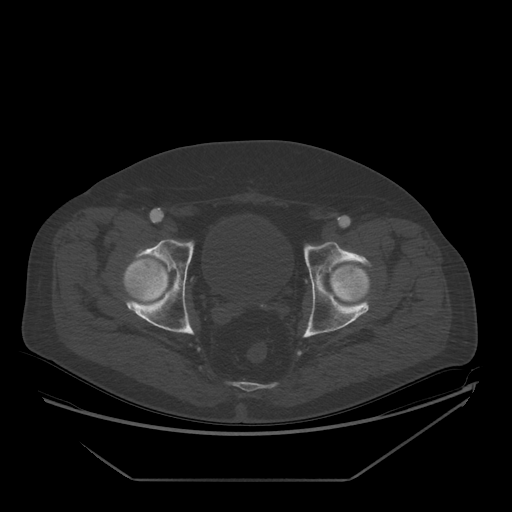
[im 84/209  soft-tissue]
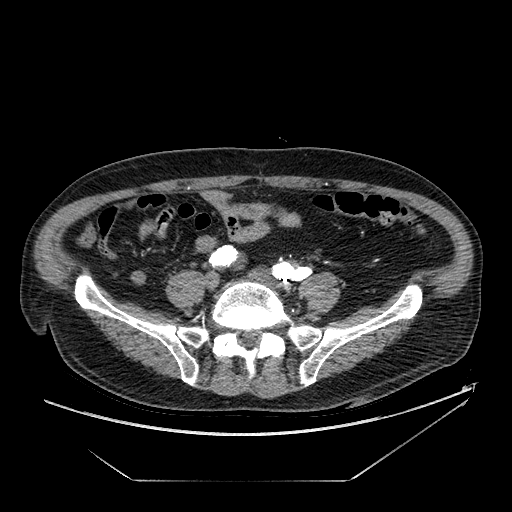
[im 84/209  lung]
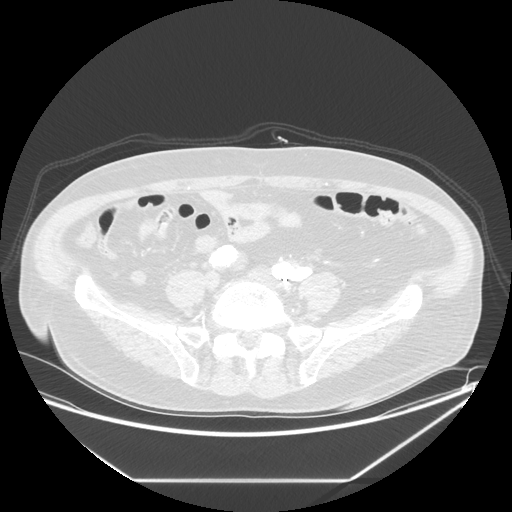
[im 125/209  soft-tissue]
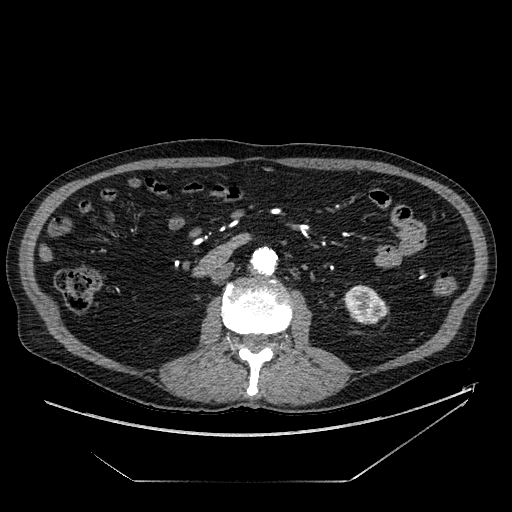
[im 125/209  lung]
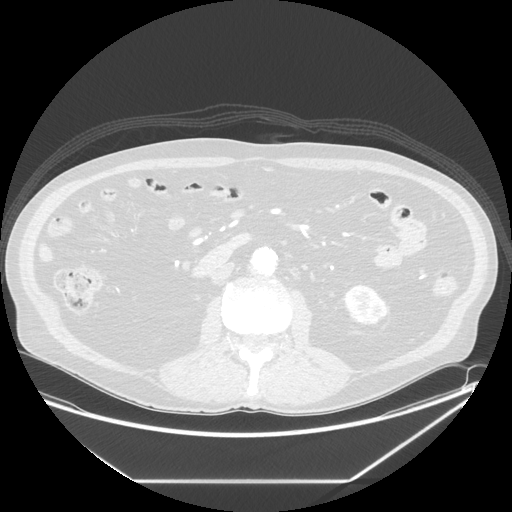
[im 167/209  soft-tissue]
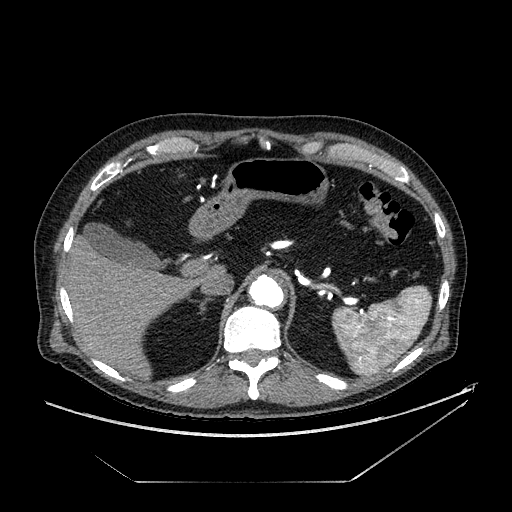
[im 167/209  lung]
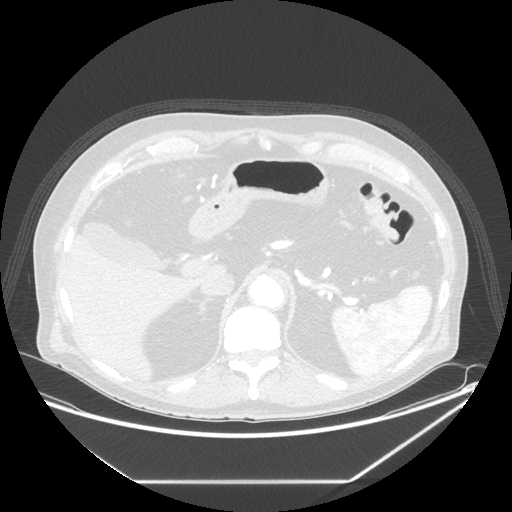

[Series 602: sagittal body · sagittal · 1.02mm/px · 3 of 174 slices shown (1 of 2)]
[im 44/174  soft-tissue]
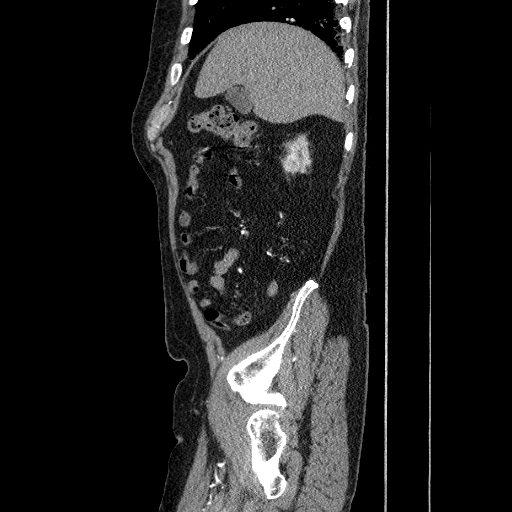
[im 87/174  soft-tissue]
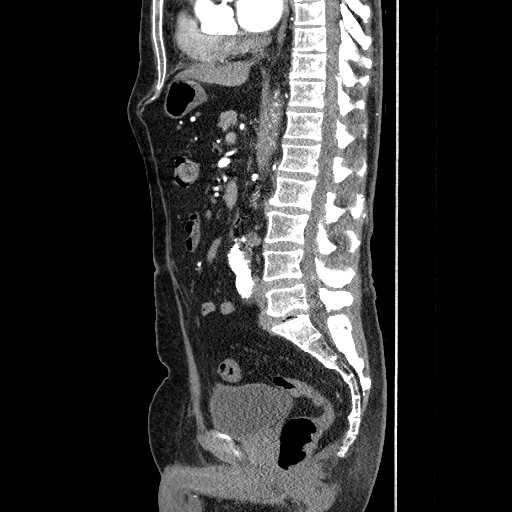
[im 130/174  soft-tissue]
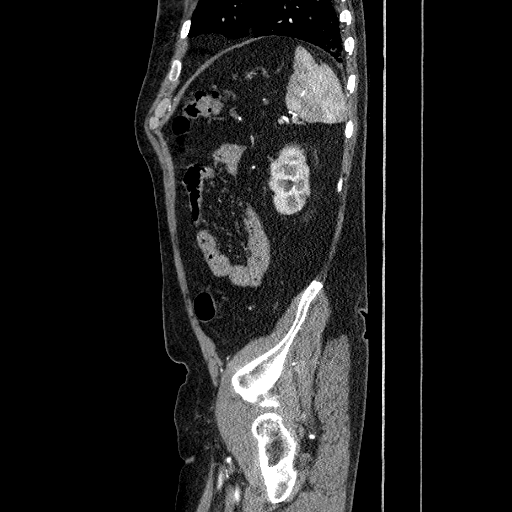

[Series 607: sagittal body · sagittal · 0.86mm/px · 3 of 174 slices shown (2 of 2)]
[im 44/174  soft-tissue]
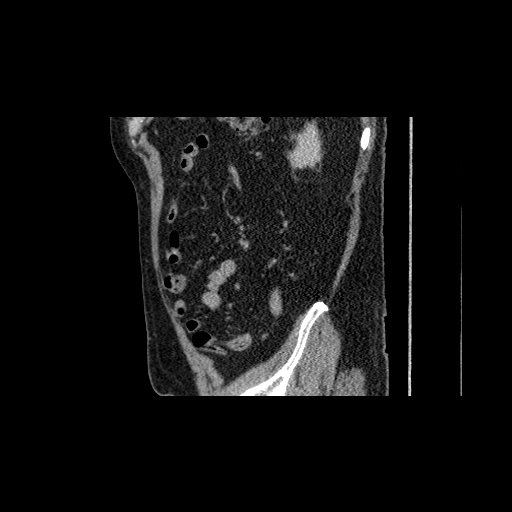
[im 87/174  soft-tissue]
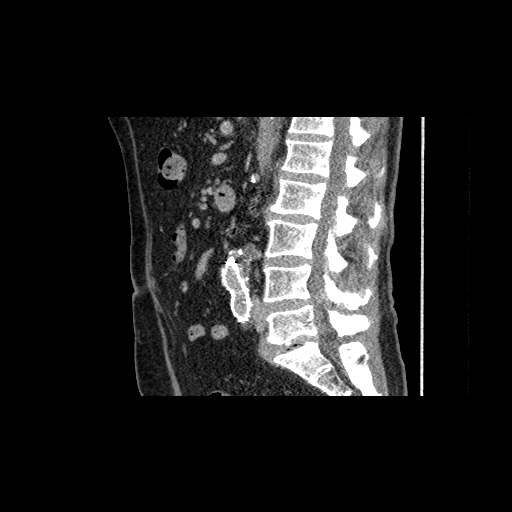
[im 130/174  soft-tissue]
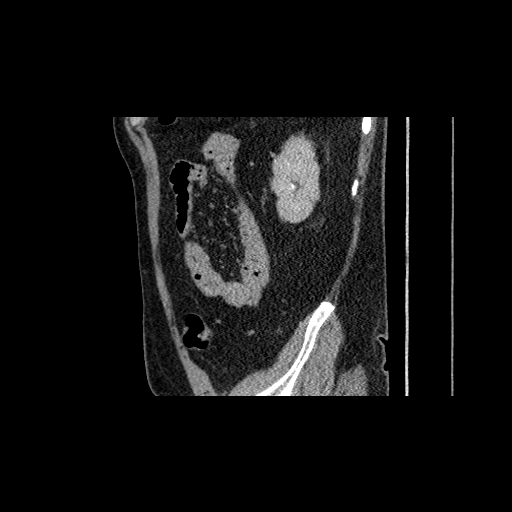

[10 of 36 positions shown; findings below may reference images not displayed]

FINDINGS: Patient is status post stent graft repair of abdominal aortic
aneurysm. The stent graft extends into the common iliac arteries.
Maximum diameter of the aneurysm sac is 4.8 cm compared to 5.1 cm
previously. No visible endoleak.

Moderate stenosis at the origin of the celiac artery. Superior
mesenteric artery is widely patent. Inferior mesenteric artery is
occluded at its origin.

Liver, gallbladder, spleen, pancreas, adrenals and kidneys are
unremarkable. Urinary bladder and prostate grossly unremarkable. No
free fluid, free air or adenopathy. Stomach, large and small bowel
unremarkable.

Areas of scarring and fibrosis in the lung bases. Heart is mildly
enlarged. No effusions. No acute bony abnormality.

Review of the MIP images confirms the above findings.
IMPRESSION: Stable or slight decreased size of the aneurysm sac, 4.8 cm
maximally. No evidence of endoleak.

## 2016-01-09 DIAGNOSIS — H35363 Drusen (degenerative) of macula, bilateral: Secondary | ICD-10-CM | POA: Diagnosis not present

## 2016-03-04 ENCOUNTER — Other Ambulatory Visit: Payer: Self-pay | Admitting: *Deleted

## 2016-03-04 MED ORDER — ISOSORBIDE MONONITRATE ER 60 MG PO TB24: ORAL_TABLET | ORAL | Status: DC

## 2016-03-05 DIAGNOSIS — E7800 Pure hypercholesterolemia, unspecified: Secondary | ICD-10-CM | POA: Diagnosis not present

## 2016-03-05 DIAGNOSIS — I10 Essential (primary) hypertension: Secondary | ICD-10-CM | POA: Diagnosis not present

## 2016-03-05 DIAGNOSIS — E1122 Type 2 diabetes mellitus with diabetic chronic kidney disease: Secondary | ICD-10-CM | POA: Diagnosis not present

## 2016-03-05 DIAGNOSIS — E1159 Type 2 diabetes mellitus with other circulatory complications: Secondary | ICD-10-CM | POA: Diagnosis not present

## 2016-03-08 DIAGNOSIS — E7800 Pure hypercholesterolemia, unspecified: Secondary | ICD-10-CM | POA: Diagnosis not present

## 2016-03-08 DIAGNOSIS — I714 Abdominal aortic aneurysm, without rupture: Secondary | ICD-10-CM | POA: Diagnosis not present

## 2016-03-08 DIAGNOSIS — I779 Disorder of arteries and arterioles, unspecified: Secondary | ICD-10-CM | POA: Diagnosis not present

## 2016-03-08 DIAGNOSIS — I10 Essential (primary) hypertension: Secondary | ICD-10-CM | POA: Diagnosis not present

## 2016-03-08 DIAGNOSIS — E1159 Type 2 diabetes mellitus with other circulatory complications: Secondary | ICD-10-CM | POA: Diagnosis not present

## 2016-04-15 DIAGNOSIS — E11319 Type 2 diabetes mellitus with unspecified diabetic retinopathy without macular edema: Secondary | ICD-10-CM | POA: Diagnosis not present

## 2016-04-23 DIAGNOSIS — S39011A Strain of muscle, fascia and tendon of abdomen, initial encounter: Secondary | ICD-10-CM | POA: Diagnosis not present

## 2016-04-23 DIAGNOSIS — M47812 Spondylosis without myelopathy or radiculopathy, cervical region: Secondary | ICD-10-CM | POA: Diagnosis not present

## 2016-06-24 DIAGNOSIS — K5901 Slow transit constipation: Secondary | ICD-10-CM | POA: Diagnosis not present

## 2016-06-24 DIAGNOSIS — M545 Low back pain: Secondary | ICD-10-CM | POA: Diagnosis not present

## 2016-06-25 DIAGNOSIS — I129 Hypertensive chronic kidney disease with stage 1 through stage 4 chronic kidney disease, or unspecified chronic kidney disease: Secondary | ICD-10-CM | POA: Diagnosis not present

## 2016-06-25 DIAGNOSIS — Z79899 Other long term (current) drug therapy: Secondary | ICD-10-CM | POA: Diagnosis not present

## 2016-06-25 DIAGNOSIS — R809 Proteinuria, unspecified: Secondary | ICD-10-CM | POA: Diagnosis not present

## 2016-06-25 DIAGNOSIS — D649 Anemia, unspecified: Secondary | ICD-10-CM | POA: Diagnosis not present

## 2016-06-25 DIAGNOSIS — E559 Vitamin D deficiency, unspecified: Secondary | ICD-10-CM | POA: Diagnosis not present

## 2016-06-25 DIAGNOSIS — N183 Chronic kidney disease, stage 3 (moderate): Secondary | ICD-10-CM | POA: Diagnosis not present

## 2016-07-02 DIAGNOSIS — M109 Gout, unspecified: Secondary | ICD-10-CM | POA: Diagnosis not present

## 2016-07-02 DIAGNOSIS — I10 Essential (primary) hypertension: Secondary | ICD-10-CM | POA: Diagnosis not present

## 2016-07-02 DIAGNOSIS — N183 Chronic kidney disease, stage 3 (moderate): Secondary | ICD-10-CM | POA: Diagnosis not present

## 2016-07-02 DIAGNOSIS — E875 Hyperkalemia: Secondary | ICD-10-CM | POA: Diagnosis not present

## 2016-07-02 DIAGNOSIS — E1129 Type 2 diabetes mellitus with other diabetic kidney complication: Secondary | ICD-10-CM | POA: Diagnosis not present

## 2016-07-10 DIAGNOSIS — E7800 Pure hypercholesterolemia, unspecified: Secondary | ICD-10-CM | POA: Diagnosis not present

## 2016-07-10 DIAGNOSIS — N183 Chronic kidney disease, stage 3 (moderate): Secondary | ICD-10-CM | POA: Diagnosis not present

## 2016-07-10 DIAGNOSIS — E1165 Type 2 diabetes mellitus with hyperglycemia: Secondary | ICD-10-CM | POA: Diagnosis not present

## 2016-07-10 DIAGNOSIS — I10 Essential (primary) hypertension: Secondary | ICD-10-CM | POA: Diagnosis not present

## 2016-07-12 DIAGNOSIS — I779 Disorder of arteries and arterioles, unspecified: Secondary | ICD-10-CM | POA: Diagnosis not present

## 2016-07-12 DIAGNOSIS — I10 Essential (primary) hypertension: Secondary | ICD-10-CM | POA: Diagnosis not present

## 2016-07-12 DIAGNOSIS — I714 Abdominal aortic aneurysm, without rupture: Secondary | ICD-10-CM | POA: Diagnosis not present

## 2016-07-12 DIAGNOSIS — E1159 Type 2 diabetes mellitus with other circulatory complications: Secondary | ICD-10-CM | POA: Diagnosis not present

## 2016-07-12 DIAGNOSIS — E7800 Pure hypercholesterolemia, unspecified: Secondary | ICD-10-CM | POA: Diagnosis not present

## 2016-07-12 DIAGNOSIS — Z23 Encounter for immunization: Secondary | ICD-10-CM | POA: Diagnosis not present

## 2016-08-02 ENCOUNTER — Encounter: Payer: Self-pay | Admitting: Family

## 2016-08-06 ENCOUNTER — Encounter: Payer: Self-pay | Admitting: Family

## 2016-08-06 ENCOUNTER — Ambulatory Visit (HOSPITAL_COMMUNITY)
Admission: RE | Admit: 2016-08-06 | Discharge: 2016-08-06 | Disposition: A | Discharge status: Home / Self Care | Payer: Medicare Other | Source: Ambulatory Visit | Attending: Family | Admitting: Family

## 2016-08-06 ENCOUNTER — Ambulatory Visit (INDEPENDENT_AMBULATORY_CARE_PROVIDER_SITE_OTHER): Payer: Medicare Other | Admitting: Family

## 2016-08-06 ENCOUNTER — Ambulatory Visit (INDEPENDENT_AMBULATORY_CARE_PROVIDER_SITE_OTHER)
Admission: RE | Admit: 2016-08-06 | Discharge: 2016-08-06 | Disposition: A | Discharge status: Home / Self Care | Payer: Medicare Other | Source: Ambulatory Visit | Attending: Vascular Surgery | Admitting: Vascular Surgery

## 2016-08-06 VITALS — BP 148/88 | HR 58 | Temp 97.0°F | Ht 72.0 in | Wt 179.0 lb

## 2016-08-06 DIAGNOSIS — I6521 Occlusion and stenosis of right carotid artery: Secondary | ICD-10-CM

## 2016-08-06 DIAGNOSIS — I6523 Occlusion and stenosis of bilateral carotid arteries: Secondary | ICD-10-CM

## 2016-08-06 DIAGNOSIS — I714 Abdominal aortic aneurysm, without rupture, unspecified: Secondary | ICD-10-CM

## 2016-08-06 DIAGNOSIS — Z95828 Presence of other vascular implants and grafts: Secondary | ICD-10-CM

## 2016-08-06 DIAGNOSIS — Z9889 Other specified postprocedural states: Secondary | ICD-10-CM | POA: Diagnosis not present

## 2016-08-06 LAB — VAS US CAROTID
LCCADDIAS: 18 cm/s
LCCAPSYS: 110 cm/s
LEFT ECA DIAS: -2 cm/s
LEFT VERTEBRAL DIAS: -4 cm/s
LICAPDIAS: -16 cm/s
Left CCA dist sys: 67 cm/s
Left CCA prox dias: 21 cm/s
Left ICA prox sys: -54 cm/s
RIGHT CCA MID DIAS: 14 cm/s
RIGHT ECA DIAS: -2 cm/s
RIGHT VERTEBRAL DIAS: 3 cm/s
Right CCA prox dias: 16 cm/s
Right CCA prox sys: 88 cm/s
Right cca dist sys: -118 cm/s

## 2016-08-06 NOTE — Patient Instructions (Signed)
Stroke Prevention Some medical conditions and behaviors are associated with an increased chance of having a stroke. You may prevent a stroke by making healthy choices and managing medical conditions. HOW CAN I REDUCE MY RISK OF HAVING A STROKE?   Stay physically active. Get at least 30 minutes of activity on most or all days.  Do not smoke. It may also be helpful to avoid exposure to secondhand smoke.  Limit alcohol use. Moderate alcohol use is considered to be:  No more than 2 drinks per day for men.  No more than 1 drink per day for nonpregnant women.  Eat healthy foods. This involves:  Eating 5 or more servings of fruits and vegetables a day.  Making dietary changes that address high blood pressure (hypertension), high cholesterol, diabetes, or obesity.  Manage your cholesterol levels.  Making food choices that are high in fiber and low in saturated fat, trans fat, and cholesterol may control cholesterol levels.  Take any prescribed medicines to control cholesterol as directed by your health care provider.  Manage your diabetes.  Controlling your carbohydrate and sugar intake is recommended to manage diabetes.  Take any prescribed medicines to control diabetes as directed by your health care provider.  Control your hypertension.  Making food choices that are low in salt (sodium), saturated fat, trans fat, and cholesterol is recommended to manage hypertension.  Ask your health care provider if you need treatment to lower your blood pressure. Take any prescribed medicines to control hypertension as directed by your health care provider.  If you are 18-39 years of age, have your blood pressure checked every 3-5 years. If you are 40 years of age or older, have your blood pressure checked every year.  Maintain a healthy weight.  Reducing calorie intake and making food choices that are low in sodium, saturated fat, trans fat, and cholesterol are recommended to manage  weight.  Stop drug abuse.  Avoid taking birth control pills.  Talk to your health care provider about the risks of taking birth control pills if you are over 35 years old, smoke, get migraines, or have ever had a blood clot.  Get evaluated for sleep disorders (sleep apnea).  Talk to your health care provider about getting a sleep evaluation if you snore a lot or have excessive sleepiness.  Take medicines only as directed by your health care provider.  For some people, aspirin or blood thinners (anticoagulants) are helpful in reducing the risk of forming abnormal blood clots that can lead to stroke. If you have the irregular heart rhythm of atrial fibrillation, you should be on a blood thinner unless there is a good reason you cannot take them.  Understand all your medicine instructions.  Make sure that other conditions (such as anemia or atherosclerosis) are addressed. SEEK IMMEDIATE MEDICAL CARE IF:   You have sudden weakness or numbness of the face, arm, or leg, especially on one side of the body.  Your face or eyelid droops to one side.  You have sudden confusion.  You have trouble speaking (aphasia) or understanding.  You have sudden trouble seeing in one or both eyes.  You have sudden trouble walking.  You have dizziness.  You have a loss of balance or coordination.  You have a sudden, severe headache with no known cause.  You have new chest pain or an irregular heartbeat. Any of these symptoms may represent a serious problem that is an emergency. Do not wait to see if the symptoms will   go away. Get medical help at once. Call your local emergency services (911 in [place] Do not drive yourself to the hospital.   This information is not intended to replace advice given to you by your health care provider. Make sure you discuss any questions you have with your health care provider.   Document Released: 10/24/2004 Document Revised: 10/07/2014 Document Reviewed:  03/19/2013 Elsevier Interactive Patient Education 2016 Elsevier Inc.  

## 2016-08-06 NOTE — Progress Notes (Signed)
[facility]   CC: Follow up s/p EVAR  History of Present Illness  Luke [name] is a 82 y.o. ([birth year]) male patient of Dr. Arbie CookeyEarly who returns today for followup of right carotid endarterectomy performed in 2010 and stent graft repair for aortic aneurysm performed in 2012. He is able to do whatever he feels like doing with some generalized tiredness but to limitation. He denies any focal neurologic deficits.  The patient denies any history of TIA or stroke symptoms, specifically the patient denies a history of amaurosis fugax or monocular blindness, denies a history unilateral  of facial drooping, denies a history of hemiplegia, and denies a history of receptive or expressive aphasia.    The patient denies claudication symptoms with walking, denies non healing wounds. The patient states that he has been short of breath since his MI in 1995, he denies being any more short of breath lately.  Wife states pt has been to his PCP re a cold/cough, is improving from this. Wife states pt's blood pressure increases in a medical setting.   Pt Diabetic: Yes, wife states last A1C was 7.? Pt smoker: former smoker, quit in the 1980's   Pt meds include: Statin : Yes ASA: Yes Other anticoagulants/antiplatelets: no    Past Medical History:  Diagnosis Date  . Abdominal aortic aneurysm [facility](HCC)    Status post stent graft repair December 2012 - Dr. Arbie CookeyEarly  . Arthritis   . Carotid artery occlusion   . Coronary atherosclerosis of native coronary artery    Occluded RCA with left-to-right collaterals  . Essential hypertension, benign   . Gout   . Hyperlipidemia   . Myocardial infarction 1995  . Type 2 diabetes mellitus (HCC)    Past Surgical History:  Procedure Laterality Date  . CAROTID ENDARTERECTOMY  04/2009   Dr. Langston MaskerMorris no stenosis of the left internal carotid artery and patent right internal carotid artery  . CATARACT EXTRACTION, BILATERAL    . ENDOVASCULAR  STENT INSERTION  09/11/2011   Procedure: ENDOVASCULAR STENT GRAFT INSERTION;  Surgeon: Larina Earthlyodd F Early, MD;  [facility];  Service: Vascular;  Laterality: Bilateral;   Social History Social History  Substance Use Topics  . Smoking status: Former Smoker    Packs/day: 2.00    Years: 30.00    Types: Cigarettes    Start date: 03/18/1943    Quit date: 10/27/1976  . Smokeless tobacco: Never Used  . Alcohol use No   Family History Family History  Problem Relation Age of Onset  . Heart disease Mother   . Diabetes Mother   . Hypertension Father    Current Outpatient Prescriptions on File Prior to Visit  Medication Sig Dispense Refill  . allopurinol (ZYLOPRIM) 300 MG tablet Take 1 tablet by mouth Daily.    Marland Kitchen. aspirin 81 MG chewable tablet Chew 81 mg by mouth daily.      Marland Kitchen. atenolol (TENORMIN) 50 MG tablet Take 3 tablets by mouth Daily.    Marland Kitchen. COLCRYS 0.6 MG tablet Take 1 tablet by mouth Every 1 hour. Until gout relieves    . fish oil-omega-3 fatty acids 1000 MG capsule Take 1 g by mouth daily.      Marland Kitchen. HUMULIN R 100 UNIT/ML injection Inject into the skin. Sliding scale    . hydrochlorothiazide (,MICROZIDE/HYDRODIURIL,) 12.5 MG capsule Take 1 capsule by mouth Daily.    . Insulin Glargine (TOUJEO SOLOSTAR [place] ) Inject 35 Units into the skin every morning.     .Marland Kitchen  Insulin Lispro, Human, (HUMALOG [place]) Inject into the skin. Sliding Scale  Between 3 - 6 units    . isosorbide mononitrate (IMDUR) 60 MG 24 hr tablet TAKE 1 TABLET BY MOUTH EVERY DAY 30 tablet 6  . losartan (COZAAR) 25 MG tablet Take 25 mg by mouth. 3 times per week    . meloxicam (MOBIC) 7.5 MG tablet Take 7.5 mg by mouth daily as needed for pain.    . nitroGLYCERIN (NITROSTAT) 0.4 MG SL tablet Place 1 tablet (0.4 mg total) under the tongue every 5 (five) minutes x 3 doses as needed. For chest pain 25 tablet 3  . pravastatin (PRAVACHOL) 40 MG tablet Take 40 mg by mouth daily.     No current facility-administered medications on file prior to  visit.    No Known Allergies   ROS: See HPI for pertinent positives and negatives.  Physical Examination  Vitals:   08/06/16 0849  BP: (!) 148/88  Pulse: (!) 58  Temp: 97 F (36.1 C)  SpO2: 95%  Weight: 179 lb (81.2 kg)  Height: 6' (1.829 m)   Body mass index is 24.28 kg/m.  General: WDWN male in NAD, with wife GAIT: normal Eyes: PERRLA Pulmonary: Respirations are non-labored, + rales in right posterior fields,  + transient wheezing, - rhonchi.  Cardiac: regular rhythm, no detected murmur.  VASCULAR EXAM Carotid Bruits Right Left   Negative Negative    Aorta is not palpable. Radial pulses are 2+ palpable and equal.                                                                                                                                          LE Pulses Right Left       FEMORAL   palpable   palpable       POPLITEAL  not palpable  not palpable       POSTERIOR TIBIAL   not palpable  not palpable       DORSALIS PEDIS      ANTERIOR TIBIAL not palpable not palpable    Gastrointestinal: soft, nontender, BS WNL, no r/g,  no palpated masses.  Musculoskeletal: No muscle atrophy/wasting. M/S 5/5 throughout, Extremities without ischemic changes.  Neurologic: A&O X 3; Appropriate Affect ; SENSATION ;normal;  Speech is normal CN 2-12 intact except is hard of hearing, motor exam as listed above.    Medical Decision Making  Luke [name] is a 82 y.o. male who is s/p right carotid endarterectomy on 05/01/2009 and stent graft repair for aortic aneurysm performed in 2012.  He has no history of stroke or TIA. He denies back or abdominal pain.  DATA Carotid Duplex today indicates a patent right carotid endarterectomy site with no evidence for restenosis and less than 40% left internal carotid artery stenosis. Bilateral vertebral flow is antegrade. Right subclavian artery waveform is biphasic, left is triphasic. No significant  change from the last exam on  08/01/15.  EVAR duplex today demonstrates 4.8 cm largest diameter of AAA. Right CIA is 1.5 cm, Left CIA is 1.3 cm. CT on 08/01/15 was read as 4.8 cm largest diameter of AAA. No evidence of endoleak.   I discussed with the patient the importance of surveillance of the endograft.  The next endograft duplex and carotid duplex will be scheduled for 12 months.  The patient will follow up with [place] in 12 months with these studies.  I emphasized the importance of maximal medical management including strict control of blood pressure, blood glucose, and lipid levels, antiplatelet agents, obtaining regular exercise, and cessation of smoking.   Thank you for allowing [place] to participate in this patient's care.  Charisse March, RN, MSN, FNP-C [facility] [place] Office: [phone]  Clinic Physician: Early  08/06/2016, 9:15 AM

## 2016-08-21 NOTE — Addendum Note (Signed)
Addended by: Burton ApleyPETTY, Trianna Lupien A on: 08/21/2016 09:46 AM   Modules accepted: Orders

## 2016-09-10 ENCOUNTER — Other Ambulatory Visit: Payer: Self-pay | Admitting: *Deleted

## 2016-09-10 MED ORDER — ISOSORBIDE MONONITRATE ER 60 MG PO TB24: ORAL_TABLET | ORAL | 6 refills | Status: DC

## 2016-09-20 DIAGNOSIS — Z23 Encounter for immunization: Secondary | ICD-10-CM | POA: Diagnosis not present

## 2016-09-20 DIAGNOSIS — L03115 Cellulitis of right lower limb: Secondary | ICD-10-CM | POA: Diagnosis not present

## 2016-09-24 ENCOUNTER — Encounter: Payer: Self-pay | Admitting: *Deleted

## 2016-09-25 ENCOUNTER — Encounter: Payer: Self-pay | Admitting: *Deleted

## 2016-09-25 ENCOUNTER — Encounter: Payer: Self-pay | Admitting: Cardiology

## 2016-09-25 ENCOUNTER — Ambulatory Visit (INDEPENDENT_AMBULATORY_CARE_PROVIDER_SITE_OTHER): Payer: Medicare Other | Admitting: Cardiology

## 2016-09-25 VITALS — BP 179/78 | HR 57 | Ht 72.0 in | Wt 183.0 lb

## 2016-09-25 DIAGNOSIS — I10 Essential (primary) hypertension: Secondary | ICD-10-CM | POA: Diagnosis not present

## 2016-09-25 DIAGNOSIS — I2510 Atherosclerotic heart disease of native coronary artery without angina pectoris: Secondary | ICD-10-CM | POA: Diagnosis not present

## 2016-09-25 DIAGNOSIS — E782 Mixed hyperlipidemia: Secondary | ICD-10-CM | POA: Diagnosis not present

## 2016-09-25 DIAGNOSIS — I714 Abdominal aortic aneurysm, without rupture, unspecified: Secondary | ICD-10-CM

## 2016-09-25 MED ORDER — NITROGLYCERIN 0.4 MG SL SUBL: 0.4000 mg | SUBLINGUAL_TABLET | SUBLINGUAL | 3 refills | Status: AC | PRN

## 2016-09-25 NOTE — Patient Instructions (Signed)

## 2016-09-25 NOTE — Progress Notes (Signed)
Cardiology Office Note  Date: 09/25/2016   ID: Luke NuttingCurtis W [name], DOB 1928-10-25, MRN [id number]  PCP: Luke PandySASSER,Luke W, MD  Primary Cardiologist: Luke DellSamuel Katerine Morua, MD   Chief Complaint  Patient presents with  . Coronary Artery Disease    History of Present Illness: Luke [name] is an 82 y.o. male last seen in December 2016. He presents with his wife for a routine follow-up visit. He does not report any angina symptoms or nitroglycerin use. Remains functional with his ADLs including outdoor work.  We reviewed his medications, stable from a cardiac perspective and outlined below. Plan to obtain last lipid panel from Luke [name] for review. I reviewed his ECG today which shows normal sinus rhythm.  He continues to follow with [name] status post right carotid endarterectomy performed in 2010 and stent graft repair for aortic aneurysm performed in 2012. I reviewed the note from November.  Last ischemic testing was in 2015 as outlined below.  Past Medical History:  Diagnosis Date  . Abdominal aortic aneurysm [facility](HCC)    Status post stent graft repair December 2012 - Luke [name]  . Arthritis   . Carotid artery occlusion   . Coronary atherosclerosis of native coronary artery    Occluded RCA with left-to-right collaterals  . Essential hypertension, benign   . Gout   . Hyperlipidemia   . Myocardial infarction 1995  . Type 2 diabetes mellitus (HCC)     Past Surgical History:  Procedure Laterality Date  . CAROTID ENDARTERECTOMY  04/2009   Dr. Langston [name] no stenosis of the left internal carotid artery and patent right internal carotid artery  . CATARACT EXTRACTION, BILATERAL    . ENDOVASCULAR STENT INSERTION  09/11/2011   Procedure: ENDOVASCULAR STENT GRAFT INSERTION;  Surgeon: Luke Earthlyodd F Early, MD;  [facility];  Service: Vascular;  Laterality: Bilateral;    Current Outpatient Prescriptions  Medication Sig Dispense Refill  . allopurinol (ZYLOPRIM) 300 MG tablet Take 1 tablet by mouth Daily.     Marland Kitchen. aspirin 81 MG chewable tablet Chew 81 mg by mouth daily.      Marland Kitchen. atenolol (TENORMIN) 50 MG tablet Take 3 tablets by mouth Daily.    Marland Kitchen. COLCRYS 0.6 MG tablet Take 1 tablet by mouth daily. Until gout relieves    . fish oil-omega-3 fatty acids 1000 MG capsule Take 1 g by mouth daily.      . hydrochlorothiazide (,MICROZIDE/HYDRODIURIL,) 12.5 MG capsule Take 1 capsule by mouth Daily.    . Insulin Glargine (TOUJEO SOLOSTAR [place]) Inject 35 Units into the skin every morning.     . Insulin Lispro, Human, (HUMALOG [place]) Inject into the skin. Sliding Scale  Between 3 - 6 units    . isosorbide mononitrate (IMDUR) 60 MG 24 hr tablet TAKE 1 TABLET BY MOUTH EVERY DAY 30 tablet 6  . losartan (COZAAR) 25 MG tablet Take 25 mg by mouth. 3 times per week    . nitroGLYCERIN (NITROSTAT) 0.4 MG SL tablet Place 1 tablet (0.4 mg total) under the tongue every 5 (five) minutes x 3 doses as needed. For chest pain 25 tablet 3  . pravastatin (PRAVACHOL) 40 MG tablet Take 40 mg by mouth daily.     No current facility-administered medications for this visit.    Allergies:  Patient has no known allergies.   Social History: The patient  reports that he quit smoking about 39 years ago. His smoking use included Cigarettes. He started smoking about 73 years ago. He has a 60.00  pack-year smoking history. He has never used smokeless tobacco. He reports that he does not drink alcohol or use drugs.   ROS:  Please see the history of present illness. Otherwise, complete review of systems is positive for none.  All other systems are reviewed and negative.   Physical Exam: VS:  BP (!) 179/78   Pulse (!) 57   Ht 6' (1.829 m)   Wt 183 lb (83 kg)   BMI 24.82 kg/m , BMI Body mass index is 24.82 kg/m.  Wt Readings from Last 3 Encounters:  09/25/16 183 lb (83 kg)  08/06/16 179 lb (81.2 kg)  09/27/15 186 lb (84.4 kg)    Elderly male, appears comfortable at rest. HEENT: Conjunctiva and lids normal, oropharynx clear.  Neck: Supple, no  elevated JVP, no significant carotid bruits, no thyromegaly.  Lungs: Clear to auscultation, nonlabored breathing at rest.  Cardiac: Regular rate and rhythm, no S3, soft systolic murmur, no pericardial rub.  Abdomen: Soft, nontender, bowel sounds present.  Extremities: No pitting edema, distal pulses 2+.  Skin: Warm and dry. Musculoskeletal: No kyphosis. Neuropsychiatric: Alert and oriented 3, affect appropriate.  ECG: I personally reviewed the tracing from 09/27/2015 which showed normal sinus rhythm with increased voltage.  Recent Labwork:  October 2016: BUN 32, creatinine 1.07 July 2014: Cholesterol 125, triglycerides 72, HDL 43, LDL 68  Other Studies Reviewed Today:  Lexiscan Cardiolite 09/28/2014: FINDINGS: Stress data: Baseline EKG SR 67 bpm With infusion of Lexiscan there were no ST changes to suggest ischemia.  Nuclear data: In the initial stress images there was a moderate defect in the inferior wall (base, mid, minimally distal), inferolateral wall (base, mid, minimally distal). Otherwise normal perfusion.  IN the recovery images there was improvement in the basal inferolateral wall. On gating LVEF was calculated at 62% with basal inferior/inferolateral hypokinesis.  On review of the raw data, soft tissue (diaphragm) underlies the heart.  Impression Lexiscan Sestamibi: Electrically negative for ischemia MIBI scan with inferior/inferolateral thinning consistent with small region of scar and possible soft tissue attenuation with minimal perinfarct ischemia. LVEF 62%. Overall low risk scan No significant change from report of 2011.  Assessment and Plan:  1. CAD with known occlusion of the RCA associated with left to right collaterals. He is symptomatically stable on medical therapy and underwent ischemic testing within the last 2 years. Continue with observation. We discussed warning signs. Refill provided for nitroglycerin.  2. Essential hypertension,  blood pressure elevated today. Patient reports compliance with medications. Isolated measurement, he had a recent normal measurement at other health care visit. Continue to follow.  3. Hyperlipidemia, continues on Pravachol. Requesting most recent lipid panel from Luke [name].  4. PAD, continues to follow with [name].   Current medicines were reviewed with the patient today.   Orders Placed This Encounter  Procedures  . EKG 12-Lead    Disposition: Follow-up in one year, sooner if needed.  Signed, Jonelle SidleSamuel G. Domenica Weightman, MD, [facility]ACC 09/25/2016 9:19 AM    [facility] [address], [place], [place] [address] Phone: [phone]; Fax: [phone]

## 2016-11-04 DIAGNOSIS — E875 Hyperkalemia: Secondary | ICD-10-CM | POA: Diagnosis not present

## 2016-11-04 DIAGNOSIS — E7800 Pure hypercholesterolemia, unspecified: Secondary | ICD-10-CM | POA: Diagnosis not present

## 2016-11-04 DIAGNOSIS — I10 Essential (primary) hypertension: Secondary | ICD-10-CM | POA: Diagnosis not present

## 2016-11-04 DIAGNOSIS — E1122 Type 2 diabetes mellitus with diabetic chronic kidney disease: Secondary | ICD-10-CM | POA: Diagnosis not present

## 2016-11-04 DIAGNOSIS — E1159 Type 2 diabetes mellitus with other circulatory complications: Secondary | ICD-10-CM | POA: Diagnosis not present

## 2016-11-06 ENCOUNTER — Ambulatory Visit (INDEPENDENT_AMBULATORY_CARE_PROVIDER_SITE_OTHER): Payer: Medicare Other

## 2016-11-06 VITALS — BP 150/80 | HR 59

## 2016-11-06 DIAGNOSIS — I2510 Atherosclerotic heart disease of native coronary artery without angina pectoris: Secondary | ICD-10-CM

## 2016-11-06 DIAGNOSIS — E7800 Pure hypercholesterolemia, unspecified: Secondary | ICD-10-CM | POA: Diagnosis not present

## 2016-11-06 DIAGNOSIS — I714 Abdominal aortic aneurysm, without rupture: Secondary | ICD-10-CM | POA: Diagnosis not present

## 2016-11-06 DIAGNOSIS — R079 Chest pain, unspecified: Secondary | ICD-10-CM

## 2016-11-06 DIAGNOSIS — I10 Essential (primary) hypertension: Secondary | ICD-10-CM | POA: Diagnosis not present

## 2016-11-06 DIAGNOSIS — I779 Disorder of arteries and arterioles, unspecified: Secondary | ICD-10-CM | POA: Diagnosis not present

## 2016-11-06 DIAGNOSIS — E1159 Type 2 diabetes mellitus with other circulatory complications: Secondary | ICD-10-CM | POA: Diagnosis not present

## 2016-11-06 NOTE — Addendum Note (Signed)
Addended by: Burman NievesASHWORTH, Mahrosh Donnell T on: 11/06/2016 04:07 PM   Modules accepted: Orders

## 2016-11-06 NOTE — Progress Notes (Signed)
Patient says he was seen by Dr. Neita CarpSasser this morning & he had an EKG . Dr. Neita CarpSasser advised him to come by our office to have Dr. Diona BrownerMcdowell review the EKG.Confirmed with Dr. Dian SituSasser's office. Patient denies chest pain, shortness of breath & dizziness. He says he feels fine. EKG reviewed by Dr. Diona BrownerMcdowell. No changes. Patient notified & advised to keep follow up appt.

## 2016-11-06 NOTE — Progress Notes (Signed)
To clarify, patient was sent to our office as a walk in by Dr. Neita CarpSasser for review of an ECG today. On assessment in our office, he did not report any recent chest pain. I reviewed his ECG which showed sinus rhythm with decreased R wave progression, more prominently noted than on prior tracing, but possibly related to lead placement. I was contacted later by Dr. Neita CarpSasser who explained that Luke [name] had in fact mentioned to him experiencing a 45 minute episode of abdominal and chest discomfort yesterday. I subsequently had nursing contact Luke [name] to discuss these symptoms, and he did then admit experiencing abdominal and chest discomfort as described the day before. Today he is asymptomatic. I do not have a high suspicion that this represented ACS or that his ECG is overly concerning, but since there has been some ambiguity as to his symptoms and presentation, please have him get a troponin I level today to make sure that we do not need to pursue any further cardiac testing in the short-term.

## 2016-11-06 NOTE — Progress Notes (Signed)
Pt agreeable to get lab work done today - will come by office and pick up [facility]

## 2016-11-06 NOTE — Progress Notes (Signed)
Contacted pt regarding recent episode of chest pain that pt described to Dr. Neita CarpSasser - pt says that on Monday night woke up with chest/abdominal pain that lasted 45 mins - denies dizziness/SOB associated with chest/abdomial pain - denies taking any medications for pain - Says went right back to sleep after pain subsided and denies any chest pain or abdominal pain since that episode.

## 2016-11-06 NOTE — Addendum Note (Signed)
Addended by: Burman NievesASHWORTH, Jabree Pernice T on: 11/06/2016 04:04 PM   Modules accepted: Orders

## 2016-11-06 NOTE — Progress Notes (Signed)
Reviewed and discussed with nursing. Patient is asymptomatic and at baseline on medical therapy. I recently saw him in December 2017 for a follow-up visit. His ECG shows poor R-wave progression compared to prior, but could potentially be lead placement. In the absence of symptoms or other major clinical change, will continue observation.

## 2016-11-06 NOTE — Progress Notes (Signed)
Called [facility] lab - verbal troponin T level 0.02 - to be faxing results as well - routed to Dr Diona BrownerMcDowell

## 2016-11-07 ENCOUNTER — Telehealth: Payer: Self-pay

## 2016-11-07 NOTE — Telephone Encounter (Signed)
-----   Message from Jonelle SidleSamuel G McDowell, MD sent at 11/07/2016  8:22 AM EST ----- Results reviewed. Doubt that this is significant, particularly since labs were drawn greater than 24 hours after his symptoms. Would observe for now, certainly if he has any further chest pain we need to see him back sooner, or he should seek evaluation in the ER. A copy of this test should be [facility] to [facility]UL W, [facility].

## 2016-11-07 NOTE — Telephone Encounter (Signed)
-----   Message from Samuel G McDowell, MD sent at 11/07/2016  8:22 AM EST ----- Results reviewed. Doubt that this is significant, particularly since labs were drawn greater than 24 hours after his symptoms. Would observe for now, certainly if he has any further chest pain we need to see him back sooner, or he should seek evaluation in the ER. A copy of this test should be forwarded to SASSER,PAUL W, MD. 

## 2017-01-09 DIAGNOSIS — R5383 Other fatigue: Secondary | ICD-10-CM | POA: Diagnosis not present

## 2017-01-09 DIAGNOSIS — Z1212 Encounter for screening for malignant neoplasm of rectum: Secondary | ICD-10-CM | POA: Diagnosis not present

## 2017-01-11 DIAGNOSIS — M79671 Pain in right foot: Secondary | ICD-10-CM | POA: Diagnosis not present

## 2017-02-25 ENCOUNTER — Other Ambulatory Visit: Payer: Self-pay | Admitting: *Deleted

## 2017-02-25 MED ORDER — ISOSORBIDE MONONITRATE ER 60 MG PO TB24: ORAL_TABLET | ORAL | 6 refills | Status: DC

## 2017-02-26 ENCOUNTER — Other Ambulatory Visit: Payer: Self-pay

## 2017-02-26 MED ORDER — ISOSORBIDE MONONITRATE ER 60 MG PO TB24: ORAL_TABLET | ORAL | 6 refills | Status: DC

## 2017-02-27 DIAGNOSIS — E1122 Type 2 diabetes mellitus with diabetic chronic kidney disease: Secondary | ICD-10-CM | POA: Diagnosis not present

## 2017-02-27 DIAGNOSIS — E1165 Type 2 diabetes mellitus with hyperglycemia: Secondary | ICD-10-CM | POA: Diagnosis not present

## 2017-02-27 DIAGNOSIS — E7800 Pure hypercholesterolemia, unspecified: Secondary | ICD-10-CM | POA: Diagnosis not present

## 2017-02-27 DIAGNOSIS — E11649 Type 2 diabetes mellitus with hypoglycemia without coma: Secondary | ICD-10-CM | POA: Diagnosis not present

## 2017-02-27 DIAGNOSIS — E1159 Type 2 diabetes mellitus with other circulatory complications: Secondary | ICD-10-CM | POA: Diagnosis not present

## 2017-03-04 ENCOUNTER — Other Ambulatory Visit: Payer: Self-pay | Admitting: *Deleted

## 2017-03-04 MED ORDER — ISOSORBIDE MONONITRATE ER 60 MG PO TB24: ORAL_TABLET | ORAL | 1 refills | Status: AC

## 2017-03-05 DIAGNOSIS — I10 Essential (primary) hypertension: Secondary | ICD-10-CM | POA: Diagnosis not present

## 2017-03-05 DIAGNOSIS — E7800 Pure hypercholesterolemia, unspecified: Secondary | ICD-10-CM | POA: Diagnosis not present

## 2017-03-05 DIAGNOSIS — E1159 Type 2 diabetes mellitus with other circulatory complications: Secondary | ICD-10-CM | POA: Diagnosis not present

## 2017-03-05 DIAGNOSIS — I779 Disorder of arteries and arterioles, unspecified: Secondary | ICD-10-CM | POA: Diagnosis not present

## 2017-03-05 DIAGNOSIS — I714 Abdominal aortic aneurysm, without rupture: Secondary | ICD-10-CM | POA: Diagnosis not present

## 2017-05-28 DIAGNOSIS — K5901 Slow transit constipation: Secondary | ICD-10-CM | POA: Diagnosis not present

## 2017-06-26 DIAGNOSIS — E11649 Type 2 diabetes mellitus with hypoglycemia without coma: Secondary | ICD-10-CM | POA: Diagnosis not present

## 2017-06-26 DIAGNOSIS — E1122 Type 2 diabetes mellitus with diabetic chronic kidney disease: Secondary | ICD-10-CM | POA: Diagnosis not present

## 2017-06-26 DIAGNOSIS — I10 Essential (primary) hypertension: Secondary | ICD-10-CM | POA: Diagnosis not present

## 2017-06-26 DIAGNOSIS — E1159 Type 2 diabetes mellitus with other circulatory complications: Secondary | ICD-10-CM | POA: Diagnosis not present

## 2017-06-26 DIAGNOSIS — E7800 Pure hypercholesterolemia, unspecified: Secondary | ICD-10-CM | POA: Diagnosis not present

## 2017-06-30 DIAGNOSIS — I10 Essential (primary) hypertension: Secondary | ICD-10-CM | POA: Diagnosis not present

## 2017-06-30 DIAGNOSIS — I779 Disorder of arteries and arterioles, unspecified: Secondary | ICD-10-CM | POA: Diagnosis not present

## 2017-06-30 DIAGNOSIS — E7800 Pure hypercholesterolemia, unspecified: Secondary | ICD-10-CM | POA: Diagnosis not present

## 2017-06-30 DIAGNOSIS — N183 Chronic kidney disease, stage 3 (moderate): Secondary | ICD-10-CM | POA: Diagnosis not present

## 2017-06-30 DIAGNOSIS — I714 Abdominal aortic aneurysm, without rupture: Secondary | ICD-10-CM | POA: Diagnosis not present

## 2017-06-30 DIAGNOSIS — Z23 Encounter for immunization: Secondary | ICD-10-CM | POA: Diagnosis not present

## 2017-08-12 ENCOUNTER — Encounter (HOSPITAL_COMMUNITY): Payer: Medicare Other

## 2017-08-12 ENCOUNTER — Other Ambulatory Visit (HOSPITAL_COMMUNITY): Payer: Medicare Other

## 2017-08-12 ENCOUNTER — Ambulatory Visit: Payer: Medicare Other | Admitting: Family

## 2017-08-20 ENCOUNTER — Other Ambulatory Visit: Payer: Self-pay

## 2017-08-20 NOTE — Patient Outreach (Signed)
[facility] Care Management  08/20/2017  Nilsa NuttingCurtis W Dokes 1928/12/05 [id number]   Medication Adherence call to Mr. Antony ContrasCurtis Bolin patient is showing past due under [facility]on Losartan 25 mg spoke to patient's wife Mrs. Franklyn LorGeraldine Joangel she explain that patient has been taking Losartan 25 mg three times a week  not 1 tablet daily like it said on bottles instructions call doctor's office to clarify which one patient needs to  be taking if 1 tablet daily or 1 tablet 3 times a week left a message with doctor's nurse she will call back.    Lillia AbedAna Ollison-Moran CPhT Pharmacy Technician [facility] Care Management Direct Dial [phone]  Fax [phone] [email or web address]

## 2017-09-15 ENCOUNTER — Ambulatory Visit: Payer: Medicare Other | Admitting: Cardiology

## 2017-09-15 ENCOUNTER — Encounter: Payer: Self-pay | Admitting: Cardiology

## 2017-09-15 VITALS — BP 118/70 | HR 72 | Ht 72.0 in | Wt 174.0 lb

## 2017-09-15 DIAGNOSIS — I10 Essential (primary) hypertension: Secondary | ICD-10-CM

## 2017-09-15 DIAGNOSIS — E782 Mixed hyperlipidemia: Secondary | ICD-10-CM

## 2017-09-15 DIAGNOSIS — I714 Abdominal aortic aneurysm, without rupture, unspecified: Secondary | ICD-10-CM

## 2017-09-15 DIAGNOSIS — I2510 Atherosclerotic heart disease of native coronary artery without angina pectoris: Secondary | ICD-10-CM

## 2017-09-15 NOTE — Patient Instructions (Signed)

## 2017-09-15 NOTE — Progress Notes (Signed)
Cardiology Office Note  Date: 09/15/2017   ID: Doylene BodeCurtis W Griffo, DOB [birth year], MRN [id number]  PCP: Estanislado PandySasser, Paul W, MD  Primary Cardiologist: Nona DellSamuel McDowell, MD   Chief Complaint  Patient presents with  . Coronary Artery Disease    History of Present Illness: Luke [name] is an 83 y.o. male last seen in December 2017. He is here with his wife for a follow-up visit. He reports no major change in stamina over the last year, no progressive angina symptoms or increasing nitroglycerin use. He goes out to his shop behind the house and tinkers most days. He reports NYHA class II dyspnea.  He had follow-up with [name] last year. Doppler examination showed patent right CEA site with less than 40% LICA stenosis. Evaluation of the abdominal aorta following EVAR with largest diameter 4.8 cm.  I personally reviewed his ECG today which shows sinus rhythm with nonspecific T-wave changes and borderline low voltage in the precordial leads.  We went over his medications. Cardiac regimen includes aspirin, atenolol, HCTZ, Imdur, Cozaar, Pravachol, and as needed nitroglycerin.  Past Medical History:  Diagnosis Date  . Abdominal aortic aneurysm [facility](HCC)    Status post stent graft repair December 2012 - Dr. Arbie CookeyEarly  . Arthritis   . Carotid artery occlusion   . Coronary atherosclerosis of native coronary artery    Occluded RCA with left-to-right collaterals  . Essential hypertension, benign   . Gout   . Hyperlipidemia   . Myocardial infarction (HCC) 1995  . Type 2 diabetes mellitus (HCC)     Past Surgical History:  Procedure Laterality Date  . CAROTID ENDARTERECTOMY  04/2009   Dr. Langston MaskerMorris no stenosis of the left internal carotid artery and patent right internal carotid artery  . CATARACT EXTRACTION, BILATERAL    . ENDOVASCULAR STENT INSERTION  09/11/2011   Procedure: ENDOVASCULAR STENT GRAFT INSERTION;  Surgeon: Larina Earthlyodd F Early, MD;  [facility];  Service: Vascular;  Laterality: Bilateral;     Current Outpatient Medications  Medication Sig Dispense Refill  . allopurinol (ZYLOPRIM) 300 MG tablet Take 1 tablet by mouth Daily.    Marland Kitchen. aspirin 81 MG chewable tablet Chew 81 mg by mouth daily.      Marland Kitchen. atenolol (TENORMIN) 50 MG tablet Take 3 tablets by mouth Daily.    Marland Kitchen. COLCRYS 0.6 MG tablet Take 1 tablet by mouth daily. Until gout relieves    . fish oil-omega-3 fatty acids 1000 MG capsule Take 1 g by mouth daily.      . hydrochlorothiazide (,MICROZIDE/HYDRODIURIL,) 12.5 MG capsule Take 1 capsule by mouth Daily.    . Insulin Glargine (TOUJEO SOLOSTAR [place]) Inject 30 Units into the skin every morning.     . Insulin Lispro, Human, (HUMALOG [place]) Inject into the skin. Sliding Scale  Between 3 - 6 units    . isosorbide mononitrate (IMDUR) 60 MG 24 hr tablet TAKE 1 TABLET BY MOUTH EVERY DAY 90 tablet 1  . losartan (COZAAR) 25 MG tablet Take 12.5 mg by mouth daily.     . nitroGLYCERIN (NITROSTAT) 0.4 MG SL tablet Place 1 tablet (0.4 mg total) under the tongue every 5 (five) minutes x 3 doses as needed. For chest pain 25 tablet 3  . pravastatin (PRAVACHOL) 40 MG tablet Take 40 mg by mouth daily.     No current facility-administered medications for this visit.    Allergies:  Patient has no known allergies.   Social History: The patient  reports that he quit smoking  about 40 years ago. His smoking use included cigarettes. He started smoking about 74 years ago. He has a 60.00 pack-year smoking history. he has never used smokeless tobacco. He reports that he does not drink alcohol or use drugs.  ROS:  Please see the history of present illness. Otherwise, complete review of systems is positive for hearing loss, arthritic stiffness.  All other systems are reviewed and negative.   Physical Exam: VS:  BP 118/70   Pulse 72   Ht 6' (1.829 m)   Wt 174 lb (78.9 kg)   SpO2 98%   BMI 23.60 kg/m , BMI Body mass index is 23.6 kg/m.  Wt Readings from Last 3 Encounters:  09/15/17 174 lb (78.9 kg)   09/25/16 183 lb (83 kg)  08/06/16 179 lb (81.2 kg)    General: Elderly male, appears comfortable at rest. HEENT: Conjunctiva and lids normal, oropharynx clear. Neck: Supple, no elevated JVP or carotid bruits, no thyromegaly. Lungs: Clear to auscultation, nonlabored breathing at rest. Cardiac: Regular rate and rhythm, no S3, soft systolic murmur, no pericardial rub. Abdomen: Soft, nontender, bowel sounds present. Extremities: No pitting edema, distal pulses 2+. Skin: Warm and dry. Musculoskeletal: No kyphosis. Neuropsychiatric: Alert and oriented x3, affect grossly appropriate.  ECG: I personally reviewed the tracing from 2/6 last 2018 which showed sinus bradycardia with poor R-wave progression, rule out old anterior infarct pattern.  Recent Labwork:  October 2017: BUN 36, creatinine 1.99, potassium 4.4, AST 19, ALT 11, cholesterol 131, triglycerides 117, HDL 37, LDL 71, hemoglobin A1c 7.9  Other Studies Reviewed Today:  Lexiscan Cardiolite 09/28/2014: FINDINGS: Stress data: Baseline EKG SR 67 bpm With infusion of Lexiscan there were no ST changes to suggest ischemia.  Nuclear data: In the initial stress images there was a moderate defect in the inferior wall (base, mid, minimally distal), inferolateral wall (base, mid, minimally distal). Otherwise normal perfusion.  IN the recovery images there was improvement in the basal inferolateral wall. On gating LVEF was calculated at 62% with basal inferior/inferolateral hypokinesis.  On review of the raw data, soft tissue (diaphragm) underlies the heart.  Impression Lexiscan Sestamibi: Electrically negative for ischemia MIBI scan with inferior/inferolateral thinning consistent with small region of scar and possible soft tissue attenuation with minimal perinfarct ischemia. LVEF 62%. Overall low risk scan No significant change from report of 2011.  Assessment and Plan:  1. CAD with occluded RCA associate with left-to-right  collaterals. He remains stable without progressive angina on medical therapy and will continue with observation at this point. ECG reviewed.  2. Mixed hyperlipidemia, he remains on Pravachol. We will obtain his interval lab work from Dr. Neita CarpSasser.  3. Carotid and peripheral arterial disease, followed by [name]. I reviewed his last Doppler report.  4. Essential hypertension, blood pressure is well controlled today. No changes were made.  Current medicines were reviewed with the patient today.   Orders Placed This Encounter  Procedures  . EKG 12-Lead    Disposition: Follow-up in one year, sooner if needed.  Signed, Jonelle SidleSamuel G. McDowell, MD, [facility] 09/15/2017 12:06 PM    [facility] [address], [place], [place] [address] Phone: [phone]; Fax: [phone]

## 2017-09-24 ENCOUNTER — Other Ambulatory Visit (HOSPITAL_COMMUNITY): Payer: Medicare Other

## 2017-09-24 ENCOUNTER — Encounter (HOSPITAL_COMMUNITY): Payer: Medicare Other

## 2017-09-24 ENCOUNTER — Ambulatory Visit: Payer: Medicare Other | Admitting: Family

## 2017-10-21 ENCOUNTER — Ambulatory Visit (INDEPENDENT_AMBULATORY_CARE_PROVIDER_SITE_OTHER)
Admission: RE | Admit: 2017-10-21 | Discharge: 2017-10-21 | Disposition: A | Discharge status: Home / Self Care | Payer: Medicare Other | Source: Ambulatory Visit | Attending: Family | Admitting: Family

## 2017-10-21 ENCOUNTER — Encounter: Payer: Self-pay | Admitting: Family

## 2017-10-21 ENCOUNTER — Ambulatory Visit (HOSPITAL_COMMUNITY)
Admission: RE | Admit: 2017-10-21 | Discharge: 2017-10-21 | Disposition: A | Discharge status: Home / Self Care | Payer: Medicare Other | Source: Ambulatory Visit | Attending: Family | Admitting: Family

## 2017-10-21 ENCOUNTER — Ambulatory Visit: Payer: Medicare Other | Admitting: Family

## 2017-10-21 VITALS — BP 153/92 | HR 64 | Temp 97.3°F | Resp 16 | Ht 72.0 in | Wt 170.6 lb

## 2017-10-21 DIAGNOSIS — I6521 Occlusion and stenosis of right carotid artery: Secondary | ICD-10-CM

## 2017-10-21 DIAGNOSIS — I714 Abdominal aortic aneurysm, without rupture, unspecified: Secondary | ICD-10-CM

## 2017-10-21 DIAGNOSIS — Z9889 Other specified postprocedural states: Secondary | ICD-10-CM | POA: Insufficient documentation

## 2017-10-21 DIAGNOSIS — Z95828 Presence of other vascular implants and grafts: Secondary | ICD-10-CM

## 2017-10-21 LAB — VAS US CAROTID
LCCADDIAS: 15 cm/s
LEFT VERTEBRAL DIAS: 7 cm/s
Left CCA dist sys: 60 cm/s
Left CCA prox dias: 15 cm/s
Left CCA prox sys: 67 cm/s
Left ICA dist dias: -25 cm/s
Left ICA dist sys: -86 cm/s
Left ICA prox dias: -15 cm/s
Left ICA prox sys: -49 cm/s
RCCADSYS: -72 cm/s
RIGHT CCA MID DIAS: 13 cm/s
RIGHT VERTEBRAL DIAS: 18 cm/s
Right CCA prox dias: 13 cm/s
Right CCA prox sys: 58 cm/s

## 2017-10-21 NOTE — Patient Instructions (Signed)

## 2017-10-21 NOTE — Progress Notes (Signed)
[facility] [place]  CC: Follow up s/p Endovascular Repair of Abdominal Aortic Aneurysm, extracranial carotid artery stenosis    History of Present Illness  Luke [name] is a 84 y.o. (01/20/1929) male who returns today for followup of right carotid endarterectomy performed in 2010 and stent graft repair for aortic aneurysm performed in 2012 by Dr. Arbie Cookey. He is able to do whatever he feels like doing with some generalized tiredness but to limitation. He denies any focal neurologic deficits.  The patient denies any history of TIA or stroke symptoms, specifically the patient denies a history of amaurosis fugax or monocular blindness, denies a history unilateral of facial drooping, denies a history of hemiplegia, and denies a history of receptive or expressive aphasia.   The patient denies claudication symptoms with walking, denies non healing wounds. The patient states that he has been short of breath since his MI in 1995, he denies being any more short of breath lately.  Wife said pt feels abnormally cold.   Wife states pt has been to his PCP re a cold/cough, is improving from this. Wife states pt's blood pressure increases in a medical setting.   Pt Diabetic: Yes, wife states last A1C was 6.? Pt smoker: former smoker, quit in the 1980's   Pt meds include: Statin : Yes ASA: Yes Other anticoagulants/antiplatelets: no    Past Medical History:  Diagnosis Date  . Abdominal aortic aneurysm [facility])    Status post stent graft repair December 2012 - Dr. Arbie Cookey  . Arthritis   . Carotid artery occlusion   . Coronary atherosclerosis of native coronary artery    Occluded RCA with left-to-right collaterals  . Essential hypertension, benign   . Gout   . Hyperlipidemia   . Myocardial infarction (HCC) 1995  . Type 2 diabetes mellitus (HCC)    Past Surgical History:  Procedure Laterality Date  . CAROTID ENDARTERECTOMY  04/2009   Dr. Langston Masker no stenosis of the left  internal carotid artery and patent right internal carotid artery  . CATARACT EXTRACTION, BILATERAL    . ENDOVASCULAR STENT INSERTION  09/11/2011   Procedure: ENDOVASCULAR STENT GRAFT INSERTION;  Surgeon: Larina Earthly, MD;  Location: [facility] OR;  Service: Vascular;  Laterality: Bilateral;   Social History Social History   Tobacco Use  . Smoking status: Former Smoker    Packs/day: 2.00    Years: 30.00    Pack years: 60.00    Types: Cigarettes    Start date: 03/18/1943    Last attempt to quit: 10/27/1976    Years since quitting: 41.0  . Smokeless tobacco: Never Used  Substance Use Topics  . Alcohol use: No    Alcohol/week: 0.0 oz  . Drug use: No   Family History Family History  Problem Relation Age of Onset  . Heart disease Mother   . Diabetes Mother   . Hypertension Father    Current Outpatient Medications on File Prior to Visit  Medication Sig Dispense Refill  . allopurinol (ZYLOPRIM) 300 MG tablet Take 1 tablet by mouth Daily.    Marland Kitchen aspirin 81 MG chewable tablet Chew 81 mg by mouth daily.      Marland Kitchen atenolol (TENORMIN) 50 MG tablet Take 3 tablets by mouth Daily.    Marland Kitchen COLCRYS 0.6 MG tablet Take 1 tablet by mouth daily. Until gout relieves    . fish oil-omega-3 fatty acids 1000 MG capsule Take 1 g by mouth daily.      . hydrochlorothiazide (,MICROZIDE/HYDRODIURIL,)  12.5 MG capsule Take 1 capsule by mouth Daily.    . Insulin Glargine (TOUJEO SOLOSTAR [place]) Inject 30 Units into the skin every morning.     . Insulin Lispro, Human, (HUMALOG [place]) Inject into the skin. Sliding Scale  Between 3 - 6 units    . isosorbide mononitrate (IMDUR) 60 MG 24 hr tablet TAKE 1 TABLET BY MOUTH EVERY DAY 90 tablet 1  . losartan (COZAAR) 25 MG tablet Take 12.5 mg by mouth daily.     . nitroGLYCERIN (NITROSTAT) 0.4 MG SL tablet Place 1 tablet (0.4 mg total) under the tongue every 5 (five) minutes x 3 doses as needed. For chest pain 25 tablet 3  . pravastatin (PRAVACHOL) 40 MG tablet Take 40 mg by mouth daily.      No current facility-administered medications on file prior to visit.    No Known Allergies   ROS: See HPI for pertinent positives and negatives.  Physical Examination  Vitals:   10/21/17 1118 10/21/17 1122  BP: (!) 160/91 (!) 153/92  Pulse: 64   Resp: 16   Temp: (!) 97.3 F (36.3 C)   TempSrc: Oral   SpO2: 99%   Weight: 170 lb 9.6 oz (77.4 kg)   Height: 6' (1.829 m)    Body mass index is 23.14 kg/m.  General: WDWN male in NAD, with wife GAIT:normal HENT: No gross abnormalities Eyes: PERRLA, pale conjunctiva Pulmonary: Respirations are non-labored, + rales 2/3 up in right posterior fields, rales in left base, no wheezing or rhonchi.  Cardiac: regular rhythm, no detected murmur.  VASCULAR EXAM Carotid Bruits Right Left   Negative Negative   Abdominal aortic pulse is not palpable. Radial pulses are 2+ palpable and equal.   LE Pulses Right Left  FEMORAL palpable palpable  POPLITEAL not palpable not palpable  POSTERIOR TIBIAL not palpable not palpable  DORSALIS PEDIS ANTERIOR TIBIAL not palpable not palpable    Gastrointestinal: soft, nontender, BS WNL, no r/g, no palpated masses. Musculoskeletal: No muscle atrophy/wasting. M/S 5/5 throughout, Extremities without ischemic changes. Skin: No rash, no cellulitis, no ulcers noted.  Neurologic: A&O X 3; appropriate affect, sensation is normal, speech is normal, CN 2-12 intact except is hard of hearing, motor exam as listed above.    Psychiatric: Normal thought content, mood appropriate for clinical situation.     DATA  Carotid Duplex (10/21/17): Patent right carotid endarterectomy site with 1-39% restenosis and less than 40% left internal carotid artery stenosis. Bilateral vertebral artery flow is antegrade.  Bilateral subclavian artery waveforms are normal.  No significant change from the exams on 08/01/15 and 08-26-16.  EVAR Duplex  (10/21/17): 4.6 cm is largest diameter at distal abdominal aorta; Right CIA: 1.5 cm; Left CIA: 1.6 cm.  No endoleak documented.   Previous: (08-06-26) 4.8 cm largest diameter of AAA. Right CIA is 1.5 cm, Left CIA is 1.3 cm. CT on 08/01/15 was read as 4.8 cm largest diameter of AAA. No evidence of endoleak.   Medical Decision Making  Kathrynn RunningCurtis W Lorin PicketScott is a 84 y.o. male who is s/p right carotid endarterectomyon8/10/2008 and stent graft repair for aortic aneurysm performed in 2012.  Slight decrease in EVAR/AAA sac size, from 4.8 cm to 4.6 cm.  Stable extracranial carotid artery stenosis.  He has no history of stroke or TIA. He denies back or abdominal pain.  I advised pt and wife to notify his PCP of significant lung congestion (rales 2/3 up right posterior fields, also in left base), feeling cold, and  pale conjunctiva.    I discussed with the patient the importance of surveillance of the endograft.  The next endograft duplex and carotid duplex will be scheduled for 12 months.  The patient will follow up with [place] in 12 months with these studies.   I emphasized the importance of maximal medical management including strict control of blood pressure, blood glucose, and lipid levels, antiplatelet agents, obtaining regular exercise, and cessation of smoking.   Thank you for allowing [place] to participate in this patient's care.  Charisse March, RN, MSN, FNP-C [facility] [place] Office: [phone]  Clinic Physician: Edilia Bo  10/21/2017, 11:24 AM

## 2017-10-22 DIAGNOSIS — R5383 Other fatigue: Secondary | ICD-10-CM | POA: Diagnosis not present

## 2017-10-22 DIAGNOSIS — R05 Cough: Secondary | ICD-10-CM | POA: Diagnosis not present

## 2017-10-27 DIAGNOSIS — E1159 Type 2 diabetes mellitus with other circulatory complications: Secondary | ICD-10-CM | POA: Diagnosis not present

## 2017-10-27 DIAGNOSIS — E1122 Type 2 diabetes mellitus with diabetic chronic kidney disease: Secondary | ICD-10-CM | POA: Diagnosis not present

## 2017-10-27 DIAGNOSIS — I10 Essential (primary) hypertension: Secondary | ICD-10-CM | POA: Diagnosis not present

## 2017-10-27 DIAGNOSIS — E875 Hyperkalemia: Secondary | ICD-10-CM | POA: Diagnosis not present

## 2017-10-27 DIAGNOSIS — E7800 Pure hypercholesterolemia, unspecified: Secondary | ICD-10-CM | POA: Diagnosis not present

## 2017-10-29 DIAGNOSIS — I10 Essential (primary) hypertension: Secondary | ICD-10-CM | POA: Diagnosis not present

## 2017-10-29 DIAGNOSIS — I714 Abdominal aortic aneurysm, without rupture: Secondary | ICD-10-CM | POA: Diagnosis not present

## 2017-10-29 DIAGNOSIS — Z0001 Encounter for general adult medical examination with abnormal findings: Secondary | ICD-10-CM | POA: Diagnosis not present

## 2017-10-29 DIAGNOSIS — I779 Disorder of arteries and arterioles, unspecified: Secondary | ICD-10-CM | POA: Diagnosis not present

## 2017-10-29 DIAGNOSIS — E7800 Pure hypercholesterolemia, unspecified: Secondary | ICD-10-CM | POA: Diagnosis not present

## 2017-12-19 DIAGNOSIS — M25521 Pain in right elbow: Secondary | ICD-10-CM | POA: Diagnosis not present

## 2017-12-19 DIAGNOSIS — M25512 Pain in left shoulder: Secondary | ICD-10-CM | POA: Diagnosis not present

## 2017-12-19 DIAGNOSIS — Z6824 Body mass index (BMI) 24.0-24.9, adult: Secondary | ICD-10-CM | POA: Diagnosis not present

## 2017-12-19 DIAGNOSIS — R1033 Periumbilical pain: Secondary | ICD-10-CM | POA: Diagnosis not present

## 2017-12-19 DIAGNOSIS — M25511 Pain in right shoulder: Secondary | ICD-10-CM | POA: Diagnosis not present

## 2017-12-29 DIAGNOSIS — R1033 Periumbilical pain: Secondary | ICD-10-CM | POA: Diagnosis not present

## 2017-12-29 DIAGNOSIS — Z8679 Personal history of other diseases of the circulatory system: Secondary | ICD-10-CM | POA: Diagnosis not present

## 2017-12-29 DIAGNOSIS — Z9889 Other specified postprocedural states: Secondary | ICD-10-CM | POA: Diagnosis not present

## 2017-12-29 DIAGNOSIS — I7409 Other arterial embolism and thrombosis of abdominal aorta: Secondary | ICD-10-CM | POA: Diagnosis not present

## 2017-12-29 DIAGNOSIS — R102 Pelvic and perineal pain: Secondary | ICD-10-CM | POA: Diagnosis not present

## 2018-01-28 DIAGNOSIS — Z6824 Body mass index (BMI) 24.0-24.9, adult: Secondary | ICD-10-CM | POA: Diagnosis not present

## 2018-01-28 DIAGNOSIS — J069 Acute upper respiratory infection, unspecified: Secondary | ICD-10-CM | POA: Diagnosis not present

## 2018-01-28 DIAGNOSIS — R05 Cough: Secondary | ICD-10-CM | POA: Diagnosis not present

## 2018-02-10 ENCOUNTER — Ambulatory Visit: Payer: Medicare Other | Admitting: Vascular Surgery

## 2018-02-10 ENCOUNTER — Other Ambulatory Visit: Payer: Self-pay

## 2018-02-10 ENCOUNTER — Encounter: Payer: Self-pay | Admitting: Vascular Surgery

## 2018-02-10 VITALS — BP 119/70 | HR 83 | Temp 97.6°F | Resp 20 | Ht 72.0 in | Wt 166.0 lb

## 2018-02-10 DIAGNOSIS — I714 Abdominal aortic aneurysm, without rupture, unspecified: Secondary | ICD-10-CM

## 2018-02-10 NOTE — Progress Notes (Signed)
[facility] [place]  Patient name: Luke [name] MRN: [id number] DOB: 19-Mar-1929 Sex: male  REASON FOR VISIT: Follow-up stent graft repair abdominal aortic aneurysm  HPI: Luke [name] is a 84 y.o. male here for follow-up with his wife and daughter.  He had had episodes of abdominal pain and underwent ultrasound at [facility].  He reports that this is not related to food intake and is not positional it does come and go.  Ultrasound for evaluation of this suggested some thrombus at the level of his aortic stent for aneurysm repair and he is seen today for further discussion.  He has been followed in our office since 2012 with both CT scan and ultrasound.  Most recent was ultrasound evaluation of this in January 2019.  This showed no evidence of expansion of his aneurysm sac or endoleak.  Past Medical History:  Diagnosis Date  . Abdominal aortic aneurysm [facility])    Status post stent graft repair December 2012 - Dr. Arbie Cookey  . Arthritis   . Carotid artery occlusion   . Coronary atherosclerosis of native coronary artery    Occluded RCA with left-to-right collaterals  . Essential hypertension, benign   . Gout   . Hyperlipidemia   . Myocardial infarction (HCC) 1995  . Type 2 diabetes mellitus (HCC)     Family History  Problem Relation Age of Onset  . Heart disease Mother   . Diabetes Mother   . Hypertension Father     SOCIAL HISTORY: Social History   Tobacco Use  . Smoking status: Former Smoker    Packs/day: 2.00    Years: 30.00    Pack years: 60.00    Types: Cigarettes    Start date: 03/18/1943    Last attempt to quit: 10/27/1976    Years since quitting: 41.3  . Smokeless tobacco: Never Used  Substance Use Topics  . Alcohol use: No    Alcohol/week: 0.0 oz    No Known Allergies  Current Outpatient Medications  Medication Sig Dispense Refill  . allopurinol (ZYLOPRIM) 300 MG tablet Take 1 tablet by mouth  Daily.    Marland Kitchen aspirin 81 MG chewable tablet Chew 81 mg by mouth daily.      Marland Kitchen atenolol (TENORMIN) 50 MG tablet Take 3 tablets by mouth Daily.    Marland Kitchen COLCRYS 0.6 MG tablet Take 1 tablet by mouth daily. Until gout relieves    . hydrochlorothiazide (,MICROZIDE/HYDRODIURIL,) 12.5 MG capsule Take 1 capsule by mouth Daily.    . Insulin Glargine (TOUJEO SOLOSTAR [place]) Inject 30 Units into the skin every morning.     . Insulin Lispro, Human, (HUMALOG [place]) Inject into the skin. Sliding Scale  Between 3 - 6 units    . isosorbide mononitrate (IMDUR) 60 MG 24 hr tablet TAKE 1 TABLET BY MOUTH EVERY DAY 90 tablet 1  . losartan (COZAAR) 25 MG tablet Take 12.5 mg by mouth daily.     . nitroGLYCERIN (NITROSTAT) 0.4 MG SL tablet Place 1 tablet (0.4 mg total) under the tongue every 5 (five) minutes x 3 doses as needed. For chest pain 25 tablet 3  . pravastatin (PRAVACHOL) 40 MG tablet Take 40 mg by mouth daily.     No current facility-administered medications for this visit.     REVIEW OF SYSTEMS:   denotes positive finding,  denotes negative finding Cardiac  Comments:  Chest pain or chest pressure:    Shortness of breath upon exertion:  Short of breath when lying flat:    Irregular heart rhythm:        Vascular    Pain in calf, thigh, or hip brought on by ambulation:    Pain in feet at night that wakes you up from your sleep:     Blood clot in your veins:    Leg swelling:           PHYSICAL EXAM: Vitals:   02/10/18 1031 02/10/18 1035  BP: 138/73 119/70  Pulse: 83   Resp: 20   Temp: 97.6 F (36.4 C)   TempSrc: Oral   SpO2: 100%   Weight: 166 lb (75.3 kg)   Height: 6' (1.829 m)     GENERAL: The patient is a well-nourished male, in no acute distress. The vital signs are documented above. CARDIOVASCULAR: Carotid arteries without bruits bilaterally.  2+ radial, 2+ femoral, 2+ popliteal and 2+ dorsalis pedis pulses bilaterally.  Abdominal exam reveals a palpable aneurysm with no pulsation or  tenderness. PULMONARY: There is good air exchange  MUSCULOSKELETAL: There are no major deformities or cyanosis. NEUROLOGIC: No focal weakness or paresthesias are detected. SKIN: There are no ulcers or rashes noted. PSYCHIATRIC: The patient has a normal affect.  DATA:  Reviewed his actual films from his study at [facility] and also the report.  The report is somewhat confusing.  In the body of the report there is discussion of a stent within the aorta and also was throughout much of the area of the aneurysm.  In the conclusion there is discussion of stent placement and abdominal aortic aneurysm with extensive thrombus within the area of the stent.  Overall diameter is slightly down at 4.4 cm.  MEDICAL ISSUES: I had a long discussion with the patient and his family present.  I do not see any evidence of thrombus within his stent graft.  I think that there was a miscommunication on the report and that he has thrombus in the aneurysm sac as is always the case.  There is no evidence of thrombus within the stent graft.  I would recommend continued yearly follow-up and no other imaging studies at this time.  Patient was reassured with this discussion will see [place] in 1 year    Larina Earthly, MD [facility] Tel [phone] Pager [phone]

## 2018-02-24 ENCOUNTER — Other Ambulatory Visit: Payer: Self-pay

## 2018-02-24 NOTE — Patient Outreach (Signed)
[facility] Customer service manager [facility]) Care Management  02/24/2018  Luke [name] 1929/07/09 [id number]   Medication Adherence call to [name] spoke with patient's wife she said patient is only taking 1/2 tablet and sometimes he only takes it three times a week per doctor's new instructions patient will fail the metric patient is past due on Losartan 25 mg. Under [facility] Ins.   Lillia Abed CPhT Pharmacy Technician [facility] Care Management Direct Dial [phone]  Fax [phone] [email or web address]

## 2018-02-25 DIAGNOSIS — E1122 Type 2 diabetes mellitus with diabetic chronic kidney disease: Secondary | ICD-10-CM | POA: Diagnosis not present

## 2018-02-25 DIAGNOSIS — I10 Essential (primary) hypertension: Secondary | ICD-10-CM | POA: Diagnosis not present

## 2018-02-25 DIAGNOSIS — E1165 Type 2 diabetes mellitus with hyperglycemia: Secondary | ICD-10-CM | POA: Diagnosis not present

## 2018-02-25 DIAGNOSIS — E875 Hyperkalemia: Secondary | ICD-10-CM | POA: Diagnosis not present

## 2018-02-25 DIAGNOSIS — E1159 Type 2 diabetes mellitus with other circulatory complications: Secondary | ICD-10-CM | POA: Diagnosis not present

## 2018-02-25 DIAGNOSIS — R5383 Other fatigue: Secondary | ICD-10-CM | POA: Diagnosis not present

## 2018-02-25 DIAGNOSIS — E11649 Type 2 diabetes mellitus with hypoglycemia without coma: Secondary | ICD-10-CM | POA: Diagnosis not present

## 2018-03-02 DIAGNOSIS — E1159 Type 2 diabetes mellitus with other circulatory complications: Secondary | ICD-10-CM | POA: Diagnosis not present

## 2018-03-02 DIAGNOSIS — I714 Abdominal aortic aneurysm, without rupture: Secondary | ICD-10-CM | POA: Diagnosis not present

## 2018-03-02 DIAGNOSIS — I10 Essential (primary) hypertension: Secondary | ICD-10-CM | POA: Diagnosis not present

## 2018-03-02 DIAGNOSIS — E7800 Pure hypercholesterolemia, unspecified: Secondary | ICD-10-CM | POA: Diagnosis not present

## 2018-03-02 DIAGNOSIS — I779 Disorder of arteries and arterioles, unspecified: Secondary | ICD-10-CM | POA: Diagnosis not present

## 2018-05-13 DIAGNOSIS — Z6824 Body mass index (BMI) 24.0-24.9, adult: Secondary | ICD-10-CM | POA: Diagnosis not present

## 2018-05-13 DIAGNOSIS — R05 Cough: Secondary | ICD-10-CM | POA: Diagnosis not present

## 2018-05-13 DIAGNOSIS — J209 Acute bronchitis, unspecified: Secondary | ICD-10-CM | POA: Diagnosis not present

## 2018-05-30 DIAGNOSIS — E875 Hyperkalemia: Secondary | ICD-10-CM | POA: Diagnosis not present

## 2018-05-30 DIAGNOSIS — M109 Gout, unspecified: Secondary | ICD-10-CM | POA: Diagnosis not present

## 2018-05-30 DIAGNOSIS — Z7982 Long term (current) use of aspirin: Secondary | ICD-10-CM | POA: Diagnosis not present

## 2018-05-30 DIAGNOSIS — Z87891 Personal history of nicotine dependence: Secondary | ICD-10-CM | POA: Diagnosis not present

## 2018-05-30 DIAGNOSIS — R0602 Shortness of breath: Secondary | ICD-10-CM | POA: Diagnosis not present

## 2018-05-30 DIAGNOSIS — I10 Essential (primary) hypertension: Secondary | ICD-10-CM | POA: Diagnosis not present

## 2018-05-30 DIAGNOSIS — N390 Urinary tract infection, site not specified: Secondary | ICD-10-CM | POA: Diagnosis not present

## 2018-05-30 DIAGNOSIS — E860 Dehydration: Secondary | ICD-10-CM | POA: Diagnosis not present

## 2018-05-30 DIAGNOSIS — N289 Disorder of kidney and ureter, unspecified: Secondary | ICD-10-CM | POA: Diagnosis not present

## 2018-05-30 DIAGNOSIS — R410 Disorientation, unspecified: Secondary | ICD-10-CM | POA: Diagnosis not present

## 2018-05-30 DIAGNOSIS — R197 Diarrhea, unspecified: Secondary | ICD-10-CM | POA: Diagnosis not present

## 2018-05-30 DIAGNOSIS — I2510 Atherosclerotic heart disease of native coronary artery without angina pectoris: Secondary | ICD-10-CM | POA: Diagnosis not present

## 2018-05-30 DIAGNOSIS — E7800 Pure hypercholesterolemia, unspecified: Secondary | ICD-10-CM | POA: Diagnosis not present

## 2018-05-30 DIAGNOSIS — R8271 Bacteriuria: Secondary | ICD-10-CM | POA: Diagnosis not present

## 2018-05-30 DIAGNOSIS — Z79899 Other long term (current) drug therapy: Secondary | ICD-10-CM | POA: Diagnosis not present

## 2018-05-30 DIAGNOSIS — R05 Cough: Secondary | ICD-10-CM | POA: Diagnosis not present

## 2018-05-30 DIAGNOSIS — I252 Old myocardial infarction: Secondary | ICD-10-CM | POA: Diagnosis not present

## 2018-05-30 DIAGNOSIS — Z794 Long term (current) use of insulin: Secondary | ICD-10-CM | POA: Diagnosis not present

## 2018-05-30 DIAGNOSIS — E119 Type 2 diabetes mellitus without complications: Secondary | ICD-10-CM | POA: Diagnosis not present

## 2018-06-09 DIAGNOSIS — E1122 Type 2 diabetes mellitus with diabetic chronic kidney disease: Secondary | ICD-10-CM | POA: Diagnosis not present

## 2018-06-09 DIAGNOSIS — E1165 Type 2 diabetes mellitus with hyperglycemia: Secondary | ICD-10-CM | POA: Diagnosis not present

## 2018-06-09 DIAGNOSIS — I10 Essential (primary) hypertension: Secondary | ICD-10-CM | POA: Diagnosis not present

## 2018-06-09 DIAGNOSIS — E875 Hyperkalemia: Secondary | ICD-10-CM | POA: Diagnosis not present

## 2018-06-11 DIAGNOSIS — Z23 Encounter for immunization: Secondary | ICD-10-CM | POA: Diagnosis not present

## 2018-06-11 DIAGNOSIS — E7800 Pure hypercholesterolemia, unspecified: Secondary | ICD-10-CM | POA: Diagnosis not present

## 2018-06-11 DIAGNOSIS — I10 Essential (primary) hypertension: Secondary | ICD-10-CM | POA: Diagnosis not present

## 2018-06-11 DIAGNOSIS — I714 Abdominal aortic aneurysm, without rupture: Secondary | ICD-10-CM | POA: Diagnosis not present

## 2018-06-11 DIAGNOSIS — I779 Disorder of arteries and arterioles, unspecified: Secondary | ICD-10-CM | POA: Diagnosis not present

## 2018-06-11 DIAGNOSIS — E1159 Type 2 diabetes mellitus with other circulatory complications: Secondary | ICD-10-CM | POA: Diagnosis not present

## 2018-06-11 DIAGNOSIS — R05 Cough: Secondary | ICD-10-CM | POA: Diagnosis not present

## 2018-06-17 DIAGNOSIS — Z6824 Body mass index (BMI) 24.0-24.9, adult: Secondary | ICD-10-CM | POA: Diagnosis not present

## 2018-06-17 DIAGNOSIS — L03116 Cellulitis of left lower limb: Secondary | ICD-10-CM | POA: Diagnosis not present

## 2018-06-17 DIAGNOSIS — R2689 Other abnormalities of gait and mobility: Secondary | ICD-10-CM | POA: Diagnosis not present

## 2018-06-18 ENCOUNTER — Other Ambulatory Visit: Payer: Self-pay

## 2018-06-18 NOTE — Patient Outreach (Signed)
[facility] Care Management  06/18/2018  Nilsa NuttingCurtis W Mefferd 03-Sep-1929 [id number]   Medication Adherence call to Mr. Antony ContrasCurtis Egelston spoke with patients wife she said patient was taken off but, now doctor wants him to start taking it again patient is due on Pravastatin 40 mg. Mr. Lorin PicketScott is showing past due under [employer] Ins.   Lillia AbedAna Ollison-Moran CPhT Pharmacy Technician [facility] Care Management Direct Dial [phone]  Fax [phone] [email or web address]

## 2018-10-12 DIAGNOSIS — E1165 Type 2 diabetes mellitus with hyperglycemia: Secondary | ICD-10-CM | POA: Diagnosis not present

## 2018-10-12 DIAGNOSIS — E7800 Pure hypercholesterolemia, unspecified: Secondary | ICD-10-CM | POA: Diagnosis not present

## 2018-10-12 DIAGNOSIS — I10 Essential (primary) hypertension: Secondary | ICD-10-CM | POA: Diagnosis not present

## 2018-10-12 DIAGNOSIS — E1122 Type 2 diabetes mellitus with diabetic chronic kidney disease: Secondary | ICD-10-CM | POA: Diagnosis not present

## 2018-10-13 ENCOUNTER — Other Ambulatory Visit: Payer: Self-pay

## 2018-10-13 NOTE — Patient Outreach (Signed)
[facility]) Care Management  10/13/2018  Luke [name] April 18, 1929 [id number]   Medication Adherence call to Luke [name] spoke with patient's wife patient is due on Losartan 25 mg she said patient is taking 1/2 of tablet but before he was taking 1 full tablet then it was change to 1 tablet 3 times a week but, now he is only taking 1/2 tablet of Losartan 25 mg she said there are very tiny pill and sometimes it is very difficult for her to cut them, she said they have an appointment in a couple of days and will go over it with doctor.    Luke [name] CPhT Pharmacy Technician [facility] Care Management Direct Dial [phone]  Fax [phone] [email or web address]

## 2018-10-15 DIAGNOSIS — I714 Abdominal aortic aneurysm, without rupture: Secondary | ICD-10-CM | POA: Diagnosis not present

## 2018-10-15 DIAGNOSIS — E1159 Type 2 diabetes mellitus with other circulatory complications: Secondary | ICD-10-CM | POA: Diagnosis not present

## 2018-10-15 DIAGNOSIS — I779 Disorder of arteries and arterioles, unspecified: Secondary | ICD-10-CM | POA: Diagnosis not present

## 2018-10-15 DIAGNOSIS — E1165 Type 2 diabetes mellitus with hyperglycemia: Secondary | ICD-10-CM | POA: Diagnosis not present

## 2018-10-15 DIAGNOSIS — I10 Essential (primary) hypertension: Secondary | ICD-10-CM | POA: Diagnosis not present

## 2018-11-02 DIAGNOSIS — J441 Chronic obstructive pulmonary disease with (acute) exacerbation: Secondary | ICD-10-CM | POA: Diagnosis not present

## 2018-11-02 DIAGNOSIS — R05 Cough: Secondary | ICD-10-CM | POA: Diagnosis not present

## 2018-11-02 DIAGNOSIS — J069 Acute upper respiratory infection, unspecified: Secondary | ICD-10-CM | POA: Diagnosis not present

## 2018-11-02 DIAGNOSIS — Z6824 Body mass index (BMI) 24.0-24.9, adult: Secondary | ICD-10-CM | POA: Diagnosis not present

## 2018-11-17 ENCOUNTER — Other Ambulatory Visit: Payer: Self-pay

## 2018-11-17 DIAGNOSIS — Z9889 Other specified postprocedural states: Secondary | ICD-10-CM

## 2018-11-18 NOTE — Progress Notes (Deleted)
HISTORY AND PHYSICAL     CC:  follow up. Requesting Provider:  No ref. provider found  HPI: This is a 85 y.o. male here for follow up for carotid artery stenosis.  Pt is s/p right CEA by Dr. Arbie Cookey in 2010.  Pt was last seen 10/21/17 for his carotid dz.  He was asymptomatic at that time.    He was seen most recently by Dr.Early.  He is s/p EVAR in 2012.  In May, he had an episode of abdominal pain and an u/s was done and suggested some thrombus at the level of his aortic stent for aneurysm repair.  Dr. Arbie Cookey felt there may have miscommunication on the report and that he has thrombus in the aneurysm sac as is always the case.  there was no evidence of thrombus within the stent graft and it was recommended he continue yearly follow up.    The pt is on a statin for cholesterol management.  The pt is diabetic.   The pt is on BB and ARB for hypertension.   Tobacco hx:  *** The pt *** on a daily aspirin. Other AC:  ***    Past Medical History:  Diagnosis Date  . Abdominal aortic aneurysm [facility])    Status post stent graft repair December 2012 - Dr. Arbie Cookey  . Arthritis   . Carotid artery occlusion   . Coronary atherosclerosis of native coronary artery    Occluded RCA with left-to-right collaterals  . Essential hypertension, benign   . Gout   . Hyperlipidemia   . Myocardial infarction (HCC) 1995  . Type 2 diabetes mellitus (HCC)     Past Surgical History:  Procedure Laterality Date  . CAROTID ENDARTERECTOMY  04/2009   Dr. Langston Masker no stenosis of the left internal carotid artery and patent right internal carotid artery  . CATARACT EXTRACTION, BILATERAL    . ENDOVASCULAR STENT INSERTION  09/11/2011   Procedure: ENDOVASCULAR STENT GRAFT INSERTION;  Surgeon: Larina Earthly, MD;  Location: [facility] OR;  Service: Vascular;  Laterality: Bilateral;    No Known Allergies  Current Outpatient Medications  Medication Sig Dispense Refill  . allopurinol (ZYLOPRIM) 300 MG tablet Take 1 tablet by mouth  Daily.    Marland Kitchen aspirin 81 MG chewable tablet Chew 81 mg by mouth daily.      Marland Kitchen atenolol (TENORMIN) 50 MG tablet Take 3 tablets by mouth Daily.    Marland Kitchen COLCRYS 0.6 MG tablet Take 1 tablet by mouth daily. Until gout relieves    . hydrochlorothiazide (,MICROZIDE/HYDRODIURIL,) 12.5 MG capsule Take 1 capsule by mouth Daily.    . Insulin Glargine (TOUJEO SOLOSTAR [place]) Inject 30 Units into the skin every morning.     . Insulin Lispro, Human, (HUMALOG [place]) Inject into the skin. Sliding Scale  Between 3 - 6 units    . isosorbide mononitrate (IMDUR) 60 MG 24 hr tablet TAKE 1 TABLET BY MOUTH EVERY DAY 90 tablet 1  . losartan (COZAAR) 25 MG tablet Take 12.5 mg by mouth daily.     . nitroGLYCERIN (NITROSTAT) 0.4 MG SL tablet Place 1 tablet (0.4 mg total) under the tongue every 5 (five) minutes x 3 doses as needed. For chest pain 25 tablet 3  . pravastatin (PRAVACHOL) 40 MG tablet Take 40 mg by mouth daily.     No current facility-administered medications for this visit.     Family History  Problem Relation Age of Onset  . Heart disease Mother   . Diabetes Mother   .  Hypertension Father     Social History   Socioeconomic History  . Marital status: Married    Spouse name: Not on file  . Number of children: Not on file  . Years of education: Not on file  . Highest education level: Not on file  Occupational History  . Not on file  Social Needs  . Financial resource strain: Not on file  . Food insecurity:    Worry: Not on file    Inability: Not on file  . Transportation needs:    Medical: Not on file    Non-medical: Not on file  Tobacco Use  . Smoking status: Former Smoker    Packs/day: 2.00    Years: 30.00    Pack years: 60.00    Types: Cigarettes    Start date: 03/18/1943    Last attempt to quit: 10/27/1976    Years since quitting: 42.0  . Smokeless tobacco: Never Used  Substance and Sexual Activity  . Alcohol use: No    Alcohol/week: 0.0 standard drinks  . Drug use: No  . Sexual  activity: Not on file  Lifestyle  . Physical activity:    Days per week: Not on file    Minutes per session: Not on file  . Stress: Not on file  Relationships  . Social connections:    Talks on phone: Not on file    Gets together: Not on file    Attends religious service: Not on file    Active member of club or organization: Not on file    Attends meetings of clubs or organizations: Not on file    Relationship status: Not on file  . Intimate partner violence:    Fear of current or ex partner: Not on file    Emotionally abused: Not on file    Physically abused: Not on file    Forced sexual activity: Not on file  Other Topics Concern  . Not on file  Social History Narrative  . Not on file     REVIEW OF SYSTEMS:  *** [X]  denotes positive finding, [ ]  denotes negative finding Cardiac  Comments:  Chest pain or chest pressure:    Shortness of breath upon exertion:    Short of breath when lying flat:    Irregular heart rhythm:        Vascular    Pain in calf, thigh, or hip brought on by ambulation:    Pain in feet at night that wakes you up from your sleep:     Blood clot in your veins:    Leg swelling:         Pulmonary    Oxygen at home:    Productive cough:     Wheezing:         Neurologic    Sudden weakness in arms or legs:     Sudden numbness in arms or legs:     Sudden onset of difficulty speaking or slurred speech:    Temporary loss of vision in one eye:     Problems with dizziness:         Gastrointestinal    Blood in stool:     Vomited blood:         Genitourinary    Burning when urinating:     Blood in urine:        Psychiatric    Major depression:         Hematologic    Bleeding problems:  Problems with blood clotting too easily:        Skin    Rashes or ulcers:        Constitutional    Fever or chills:      PHYSICAL EXAMINATION:  ***  General:  WDWN in NAD; vital signs documented above Gait: Not observed HENT: WNL,  normocephalic Pulmonary: normal non-labored breathing , without Rales, rhonchi,  wheezing Cardiac: {Desc; regular/irreg:14544} HR, without  Murmurs, rubs or gallops; {With/Without:20273} carotid bruit*** Abdomen: soft, NT, no masses Skin: {With/Without:20273} rashes Vascular Exam/Pulses:  Right Left  Radial {Exam; arterial pulse strength 0-4:30167} {Exam; arterial pulse strength 0-4:30167}  Ulnar {Exam; arterial pulse strength 0-4:30167} {Exam; arterial pulse strength 0-4:30167}  Femoral {Exam; arterial pulse strength 0-4:30167} {Exam; arterial pulse strength 0-4:30167}  Popliteal {Exam; arterial pulse strength 0-4:30167} {Exam; arterial pulse strength 0-4:30167}  DP {Exam; arterial pulse strength 0-4:30167} {Exam; arterial pulse strength 0-4:30167}  PT {Exam; arterial pulse strength 0-4:30167} {Exam; arterial pulse strength 0-4:30167}   Extremities: {With/Without:20273} ischemic changes, {With/Without:20273} Gangrene , {With/Without:20273} cellulitis; {With/Without:20273} open wounds;  Musculoskeletal: no muscle wasting or atrophy  Neurologic: A&O X 3 Psychiatric:  The pt has {Desc; normal/abnormal:11317::"Normal"} affect.   Non-Invasive Vascular Imaging:   Carotid Duplex on 11/19/2018: Right:  ***% stenosis Left:  ***% stenosis ***  Previous Carotid duplex on 10/21/17: Right: 1-39% stenosis Left:   1-39% stenosis Vertebrals:  Both vertebral arteries were patent with antegrade flow. Subclavians: Normal flow hemodynamics were seen in bilateral subclavian arteries.   ASSESSMENT/PLAN:: 85 y.o. male here for follow up carotid artery stenosis. He is s/p right CEA in 2010 and EVAR in 2012 by Dr. Arbie CookeyEarly.  -*** -discussed s/s of stroke with pt and they understand should they develop any of these sx, they will go to the nearest ER. -In May, he had an episode of abdominal pain and an u/s was done and suggested some thrombus at the level of his aortic stent for aneurysm repair.  Dr. Arbie CookeyEarly  felt there may have miscommunication on the report and that he has thrombus in the aneurysm sac as is always the case.  there was no evidence of thrombus within the stent graft and it was recommended he continue yearly follow up.     Doreatha MassedSamantha Bracy Pepper, PA-C [facility] [phone]  Clinic MD:  Darrick PennaFields

## 2018-11-19 ENCOUNTER — Encounter (HOSPITAL_COMMUNITY): Payer: Medicare Other

## 2018-11-19 ENCOUNTER — Ambulatory Visit: Payer: Medicare Other | Admitting: Family

## 2018-11-24 DIAGNOSIS — Z6823 Body mass index (BMI) 23.0-23.9, adult: Secondary | ICD-10-CM | POA: Diagnosis not present

## 2018-11-24 DIAGNOSIS — R410 Disorientation, unspecified: Secondary | ICD-10-CM | POA: Diagnosis not present

## 2018-11-30 DIAGNOSIS — I63542 Cerebral infarction due to unspecified occlusion or stenosis of left cerebellar artery: Secondary | ICD-10-CM | POA: Diagnosis not present

## 2018-11-30 DIAGNOSIS — I63512 Cerebral infarction due to unspecified occlusion or stenosis of left middle cerebral artery: Secondary | ICD-10-CM | POA: Diagnosis not present

## 2018-11-30 DIAGNOSIS — Z8673 Personal history of transient ischemic attack (TIA), and cerebral infarction without residual deficits: Secondary | ICD-10-CM | POA: Diagnosis not present

## 2018-11-30 DIAGNOSIS — I639 Cerebral infarction, unspecified: Secondary | ICD-10-CM | POA: Diagnosis not present

## 2018-12-03 DIAGNOSIS — I77819 Aortic ectasia, unspecified site: Secondary | ICD-10-CM | POA: Diagnosis not present

## 2018-12-03 DIAGNOSIS — I348 Other nonrheumatic mitral valve disorders: Secondary | ICD-10-CM | POA: Diagnosis not present

## 2018-12-03 DIAGNOSIS — I6523 Occlusion and stenosis of bilateral carotid arteries: Secondary | ICD-10-CM | POA: Diagnosis not present

## 2018-12-03 DIAGNOSIS — I2510 Atherosclerotic heart disease of native coronary artery without angina pectoris: Secondary | ICD-10-CM | POA: Diagnosis not present

## 2018-12-03 DIAGNOSIS — I351 Nonrheumatic aortic (valve) insufficiency: Secondary | ICD-10-CM | POA: Diagnosis not present

## 2018-12-03 DIAGNOSIS — I63233 Cerebral infarction due to unspecified occlusion or stenosis of bilateral carotid arteries: Secondary | ICD-10-CM | POA: Diagnosis not present

## 2018-12-04 ENCOUNTER — Encounter: Payer: Self-pay | Admitting: Family

## 2018-12-04 ENCOUNTER — Ambulatory Visit (HOSPITAL_COMMUNITY)
Admission: RE | Admit: 2018-12-04 | Discharge: 2018-12-04 | Disposition: A | Discharge status: Home / Self Care | Payer: Medicare Other | Source: Ambulatory Visit | Attending: Vascular Surgery | Admitting: Vascular Surgery

## 2018-12-04 ENCOUNTER — Other Ambulatory Visit: Payer: Self-pay

## 2018-12-04 ENCOUNTER — Ambulatory Visit (INDEPENDENT_AMBULATORY_CARE_PROVIDER_SITE_OTHER): Payer: Medicare Other | Admitting: Physician Assistant

## 2018-12-04 VITALS — BP 94/66 | HR 83 | Temp 97.6°F | Resp 18 | Ht 72.0 in | Wt 157.6 lb

## 2018-12-04 DIAGNOSIS — I714 Abdominal aortic aneurysm, without rupture, unspecified: Secondary | ICD-10-CM

## 2018-12-04 DIAGNOSIS — Z9889 Other specified postprocedural states: Secondary | ICD-10-CM | POA: Diagnosis not present

## 2018-12-04 DIAGNOSIS — I6523 Occlusion and stenosis of bilateral carotid arteries: Secondary | ICD-10-CM

## 2018-12-04 NOTE — Progress Notes (Signed)
Established Carotid Patient   History of Present Illness   Luke [name] is a 85 y.o. (1929-06-12) male who presents with chief complaint: Carotid artery stenosis.  He is status post right CEA by Dr. early in 2010.  Patient is here in office today accompanied by his wife as well as his daughter.  The wife and daughter are the primary historians today.  They tell me that Luke [name] was diagnosed with a stroke last week based on MRI at [facility].  They state residual deficits include memory loss, confusion, and some balancing issues.  They were not sure the etiology of the stroke when asked.  Patient is here without a walker and still able to ambulate however family states his balance is not the same.  Patient denies any slurred speech, changes in vision, or one-sided weakness.  He is taking an aspirin and statin daily.  He is a former smoker.  Surgical history also significant for endovascular repair of abdominal aortic aneurysm by Dr. early in 2012.  He is due for a surveillance ultrasound in the next couple of months.  He denies any new or changing abdominal or back pain.  He also denies any rest pain or tissue changes of bilateral lower extremities.  Current Outpatient Medications  Medication Sig Dispense Refill  . allopurinol (ZYLOPRIM) 300 MG tablet Take 1 tablet by mouth Daily.    Marland Kitchen aspirin 81 MG chewable tablet Chew 81 mg by mouth daily.      Marland Kitchen atenolol (TENORMIN) 50 MG tablet Take 3 tablets by mouth Daily.    Marland Kitchen COLCRYS 0.6 MG tablet Take 1 tablet by mouth daily. Until gout relieves    . hydrochlorothiazide (,MICROZIDE/HYDRODIURIL,) 12.5 MG capsule Take 1 capsule by mouth Daily.    . Insulin Glargine (TOUJEO SOLOSTAR [place]) Inject 30 Units into the skin every morning.     . Insulin Lispro, Human, (HUMALOG [place]) Inject into the skin. Sliding Scale  Between 3 - 6 units    . isosorbide mononitrate (IMDUR) 60 MG 24 hr tablet TAKE 1 TABLET BY MOUTH EVERY DAY 90 tablet 1  . losartan  (COZAAR) 25 MG tablet Take 12.5 mg by mouth daily.     . nitroGLYCERIN (NITROSTAT) 0.4 MG SL tablet Place 1 tablet (0.4 mg total) under the tongue every 5 (five) minutes x 3 doses as needed. For chest pain 25 tablet 3  . pravastatin (PRAVACHOL) 40 MG tablet Take 40 mg by mouth daily.     No current facility-administered medications for this visit.     On ROS today: 10 system ROS negative unless otherwise noted in HPI   Physical Examination   Vitals:   12/04/18 1340 12/04/18 1343  BP: 94/65 94/66  Pulse: 83   Resp: 18   Temp: 97.6 F (36.4 C)   SpO2: 95%   Weight: 157 lb 10.1 oz (71.5 kg)   Height: 6' (1.829 m)    Body mass index is 21.38 kg/m.  General Alert, Confused, WD, Elderly  Neck Supple, mid-line trachea, Neck incision healed  Pulmonary Sym exp, good B air movt  Cardiac RRR, Nl S1, S2  Vascular Vessel Right Left  Radial Palpable Palpable  Carotid Palpable, No Bruit Palpable, No Bruit  Aorta Not palpable N/A    Gastro- intestinal soft, non-distended, non-tender to palpation, No guarding or rebound,   Musculo- skeletal M/S 5/5 throughout  , Extremities without ischemic changes, feet symmetrically warm to touch with good capillary refill  Neurologic  somewhat confused however moving all extremities well and symmetrically    Non-Invasive Vascular Imaging   B Carotid Duplex (12/04/18):   R ICA stenosis:  1-39%  R VA:  patent and antegrade  L ICA stenosis:  1-39%  L VA:  patent and antegrade   Medical Decision Making   Luke [name] is a 85 y.o. male who presents for carotid duplex to evaluate carotid artery stenosis   Recent CVA unrelated to carotid artery stenosis  Right ICA surgical site without hemodynamically significant stenosis and left ICA stable with 1 to 39% stenosis  Continue aspirin and statin daily  Repeat carotid duplex in about 12-18 months; we will attempt to coordinate this with EVAR surveillance   Emilie Rutter PA-C [facility] [place] Office: [phone]

## 2018-12-30 ENCOUNTER — Ambulatory Visit: Payer: Medicare Other | Admitting: Cardiology

## 2019-01-28 DIAGNOSIS — E1165 Type 2 diabetes mellitus with hyperglycemia: Secondary | ICD-10-CM | POA: Diagnosis not present

## 2019-01-28 DIAGNOSIS — I10 Essential (primary) hypertension: Secondary | ICD-10-CM | POA: Diagnosis not present

## 2019-01-28 DIAGNOSIS — N189 Chronic kidney disease, unspecified: Secondary | ICD-10-CM | POA: Diagnosis not present

## 2019-01-28 DIAGNOSIS — E875 Hyperkalemia: Secondary | ICD-10-CM | POA: Diagnosis not present

## 2019-01-28 DIAGNOSIS — D649 Anemia, unspecified: Secondary | ICD-10-CM | POA: Diagnosis not present

## 2019-02-04 ENCOUNTER — Ambulatory Visit: Payer: Medicare Other | Admitting: Physician Assistant

## 2019-02-09 ENCOUNTER — Ambulatory Visit: Payer: Medicare Other | Admitting: Family

## 2019-02-09 ENCOUNTER — Other Ambulatory Visit (HOSPITAL_COMMUNITY): Payer: Medicare Other

## 2019-02-10 DIAGNOSIS — I714 Abdominal aortic aneurysm, without rupture: Secondary | ICD-10-CM | POA: Diagnosis not present

## 2019-02-10 DIAGNOSIS — I779 Disorder of arteries and arterioles, unspecified: Secondary | ICD-10-CM | POA: Diagnosis not present

## 2019-02-10 DIAGNOSIS — E1159 Type 2 diabetes mellitus with other circulatory complications: Secondary | ICD-10-CM | POA: Diagnosis not present

## 2019-02-10 DIAGNOSIS — E7800 Pure hypercholesterolemia, unspecified: Secondary | ICD-10-CM | POA: Diagnosis not present

## 2019-02-10 DIAGNOSIS — I10 Essential (primary) hypertension: Secondary | ICD-10-CM | POA: Diagnosis not present

## 2019-02-10 DIAGNOSIS — Z6824 Body mass index (BMI) 24.0-24.9, adult: Secondary | ICD-10-CM | POA: Diagnosis not present

## 2019-03-17 DIAGNOSIS — M79671 Pain in right foot: Secondary | ICD-10-CM | POA: Diagnosis not present

## 2019-03-17 DIAGNOSIS — M79672 Pain in left foot: Secondary | ICD-10-CM | POA: Diagnosis not present

## 2019-03-17 DIAGNOSIS — E1140 Type 2 diabetes mellitus with diabetic neuropathy, unspecified: Secondary | ICD-10-CM | POA: Diagnosis not present

## 2019-03-19 ENCOUNTER — Ambulatory Visit: Payer: Medicare Other | Admitting: Cardiology

## 2019-03-26 ENCOUNTER — Other Ambulatory Visit: Payer: Self-pay | Admitting: Internal Medicine

## 2019-03-26 ENCOUNTER — Other Ambulatory Visit: Payer: Medicare Other

## 2019-03-26 DIAGNOSIS — Z20822 Contact with and (suspected) exposure to covid-19: Secondary | ICD-10-CM

## 2019-03-26 DIAGNOSIS — R6889 Other general symptoms and signs: Secondary | ICD-10-CM | POA: Diagnosis not present

## 2019-03-26 DIAGNOSIS — J069 Acute upper respiratory infection, unspecified: Secondary | ICD-10-CM | POA: Diagnosis not present

## 2019-03-26 NOTE — Progress Notes (Unsigned)
[id number] 

## 2019-03-30 DIAGNOSIS — E7800 Pure hypercholesterolemia, unspecified: Secondary | ICD-10-CM | POA: Diagnosis not present

## 2019-03-30 DIAGNOSIS — I10 Essential (primary) hypertension: Secondary | ICD-10-CM | POA: Diagnosis not present

## 2019-03-30 DIAGNOSIS — E119 Type 2 diabetes mellitus without complications: Secondary | ICD-10-CM | POA: Diagnosis not present

## 2019-04-01 LAB — NOVEL CORONAVIRUS, NAA: SARS-CoV-2, NAA: NOT DETECTED

## 2019-04-05 ENCOUNTER — Telehealth: Payer: Self-pay | Admitting: General Practice

## 2019-04-05 NOTE — Telephone Encounter (Signed)
Pt's wife given covid results/ Pt's wife verbalized understanding.

## 2019-04-09 DIAGNOSIS — J209 Acute bronchitis, unspecified: Secondary | ICD-10-CM | POA: Diagnosis not present

## 2019-04-30 DIAGNOSIS — I10 Essential (primary) hypertension: Secondary | ICD-10-CM | POA: Diagnosis not present

## 2019-04-30 DIAGNOSIS — E1122 Type 2 diabetes mellitus with diabetic chronic kidney disease: Secondary | ICD-10-CM | POA: Diagnosis not present

## 2019-04-30 DIAGNOSIS — E7800 Pure hypercholesterolemia, unspecified: Secondary | ICD-10-CM | POA: Diagnosis not present

## 2019-05-28 ENCOUNTER — Ambulatory Visit: Payer: Medicare Other | Admitting: Cardiology

## 2019-06-01 DEATH — deceased
